# Patient Record
Sex: Male | Born: 1985 | Race: Black or African American | Hispanic: No | Marital: Single | Smoking: Current every day smoker
Health system: Southern US, Community
[De-identification: ages and names within clinical notes are randomized; demographics above are authoritative.]

## PROBLEM LIST (undated history)

## (undated) ENCOUNTER — Emergency Department (HOSPITAL_BASED_OUTPATIENT_CLINIC_OR_DEPARTMENT_OTHER): Discharge status: Against Medical Advice | Payer: Medicaid Other

## (undated) DIAGNOSIS — F419 Anxiety disorder, unspecified: Secondary | ICD-10-CM

## (undated) DIAGNOSIS — F209 Schizophrenia, unspecified: Secondary | ICD-10-CM

## (undated) DIAGNOSIS — F32A Depression, unspecified: Secondary | ICD-10-CM

## (undated) DIAGNOSIS — F329 Major depressive disorder, single episode, unspecified: Secondary | ICD-10-CM

## (undated) DIAGNOSIS — F3181 Bipolar II disorder: Secondary | ICD-10-CM

---

## 2012-05-04 ENCOUNTER — Encounter (HOSPITAL_COMMUNITY): Payer: Self-pay | Admitting: Emergency Medicine

## 2012-05-04 ENCOUNTER — Encounter (HOSPITAL_COMMUNITY): Payer: Self-pay | Admitting: Behavioral Health

## 2012-05-04 ENCOUNTER — Emergency Department (EMERGENCY_DEPARTMENT_HOSPITAL)
Admission: EM | Admit: 2012-05-04 | Discharge: 2012-05-04 | Disposition: A | Discharge status: Other Acute Inpatient Hospital | Payer: Medicaid Other | Source: Home / Self Care | Attending: Emergency Medicine | Admitting: Emergency Medicine

## 2012-05-04 ENCOUNTER — Inpatient Hospital Stay (HOSPITAL_COMMUNITY)
Admission: AD | Admit: 2012-05-04 | Discharge: 2012-05-10 | DRG: 885 | Disposition: A | Discharge status: Home / Self Care | Payer: Medicaid Other | Source: Intra-hospital | Attending: Psychiatry | Admitting: Psychiatry

## 2012-05-04 DIAGNOSIS — F1510 Other stimulant abuse, uncomplicated: Secondary | ICD-10-CM | POA: Insufficient documentation

## 2012-05-04 DIAGNOSIS — F17200 Nicotine dependence, unspecified, uncomplicated: Secondary | ICD-10-CM | POA: Insufficient documentation

## 2012-05-04 DIAGNOSIS — F319 Bipolar disorder, unspecified: Secondary | ICD-10-CM

## 2012-05-04 DIAGNOSIS — F1999 Other psychoactive substance use, unspecified with unspecified psychoactive substance-induced disorder: Secondary | ICD-10-CM

## 2012-05-04 DIAGNOSIS — F3110 Bipolar disorder, current episode manic without psychotic features, unspecified: Principal | ICD-10-CM | POA: Diagnosis present

## 2012-05-04 DIAGNOSIS — R45851 Suicidal ideations: Secondary | ICD-10-CM | POA: Insufficient documentation

## 2012-05-04 DIAGNOSIS — F1210 Cannabis abuse, uncomplicated: Secondary | ICD-10-CM

## 2012-05-04 DIAGNOSIS — Z79899 Other long term (current) drug therapy: Secondary | ICD-10-CM

## 2012-05-04 DIAGNOSIS — F209 Schizophrenia, unspecified: Secondary | ICD-10-CM | POA: Insufficient documentation

## 2012-05-04 DIAGNOSIS — F200 Paranoid schizophrenia: Secondary | ICD-10-CM

## 2012-05-04 DIAGNOSIS — R443 Hallucinations, unspecified: Secondary | ICD-10-CM | POA: Insufficient documentation

## 2012-05-04 HISTORY — DX: Schizophrenia, unspecified: F20.9

## 2012-05-04 HISTORY — DX: Anxiety disorder, unspecified: F41.9

## 2012-05-04 HISTORY — DX: Major depressive disorder, single episode, unspecified: F32.9

## 2012-05-04 HISTORY — DX: Bipolar II disorder: F31.81

## 2012-05-04 HISTORY — DX: Depression, unspecified: F32.A

## 2012-05-04 LAB — COMPREHENSIVE METABOLIC PANEL
Albumin: 4.7 g/dL (ref 3.5–5.2)
BUN: 19 mg/dL (ref 6–23)
Chloride: 100 mEq/L (ref 96–112)
Creatinine, Ser: 1 mg/dL (ref 0.50–1.35)
GFR calc Af Amer: >90 mL/min (ref >90)
GFR calc non Af Amer: >90 mL/min (ref >90)
Glucose, Bld: 95 mg/dL (ref 70–99)
Total Bilirubin: 1.3 mg/dL — ABNORMAL HIGH (ref 0.3–1.2)

## 2012-05-04 LAB — CBC WITH DIFFERENTIAL/PLATELET
Basophils Relative: 0 % (ref 0–1)
HCT: 40.8 % (ref 39.0–52.0)
Hemoglobin: 14.2 g/dL (ref 13.0–17.0)
MCH: 31.1 pg (ref 26.0–34.0)
MCHC: 34.8 g/dL (ref 30.0–36.0)
Monocytes Absolute: 0.7 10*3/uL (ref 0.1–1.0)
Monocytes Relative: 7 % (ref 3–12)
Neutro Abs: 8.1 10*3/uL — ABNORMAL HIGH (ref 1.7–7.7)

## 2012-05-04 LAB — RAPID URINE DRUG SCREEN, HOSP PERFORMED
Cocaine: NOT DETECTED
Opiates: NOT DETECTED
Tetrahydrocannabinol: POSITIVE — AB

## 2012-05-04 LAB — ACETAMINOPHEN LEVEL: Acetaminophen (Tylenol), Serum: <15 ug/mL (ref 10–30)

## 2012-05-04 MED ORDER — NICOTINE 21 MG/24HR TD PT24
21.0000 mg | MEDICATED_PATCH | Freq: Every day | TRANSDERMAL | Status: DC
  Administered 2012-05-05 – 2012-05-10 (×4): 21 mg via TRANSDERMAL
  Filled 2012-05-04 (×7): qty 1

## 2012-05-04 MED ORDER — ZOLPIDEM TARTRATE 5 MG PO TABS: 5.0000 mg | ORAL_TABLET | Freq: Every evening | ORAL | Status: DC | PRN

## 2012-05-04 MED ORDER — ACETAMINOPHEN 325 MG PO TABS: 650.0000 mg | ORAL_TABLET | Freq: Four times a day (QID) | ORAL | Status: DC | PRN

## 2012-05-04 MED ORDER — LORAZEPAM 1 MG PO TABS: 1.0000 mg | ORAL_TABLET | Freq: Three times a day (TID) | ORAL | Status: DC | PRN

## 2012-05-04 MED ORDER — ACETAMINOPHEN 325 MG PO TABS: 650.0000 mg | ORAL_TABLET | ORAL | Status: DC | PRN

## 2012-05-04 MED ORDER — ALUM & MAG HYDROXIDE-SIMETH 200-200-20 MG/5ML PO SUSP: 30.0000 mL | ORAL | Status: DC | PRN

## 2012-05-04 MED ORDER — IBUPROFEN 600 MG PO TABS: 600.0000 mg | ORAL_TABLET | Freq: Three times a day (TID) | ORAL | Status: DC | PRN

## 2012-05-04 MED ORDER — NICOTINE 21 MG/24HR TD PT24: 21.0000 mg | MEDICATED_PATCH | Freq: Every day | TRANSDERMAL | Status: DC

## 2012-05-04 MED ORDER — QUETIAPINE FUMARATE 50 MG PO TABS
50.0000 mg | ORAL_TABLET | Freq: Every day | ORAL | Status: DC
  Administered 2012-05-04: 50 mg via ORAL
  Filled 2012-05-04 (×3): qty 1

## 2012-05-04 MED ORDER — LORAZEPAM 1 MG PO TABS
1.0000 mg | ORAL_TABLET | Freq: Three times a day (TID) | ORAL | Status: DC | PRN
  Administered 2012-05-05 – 2012-05-08 (×2): 1 mg via ORAL
  Filled 2012-05-04 (×2): qty 1

## 2012-05-04 MED ORDER — MAGNESIUM HYDROXIDE 400 MG/5ML PO SUSP: 30.0000 mL | Freq: Every day | ORAL | Status: DC | PRN

## 2012-05-04 MED ORDER — ONDANSETRON HCL 4 MG PO TABS: 4.0000 mg | ORAL_TABLET | Freq: Three times a day (TID) | ORAL | Status: DC | PRN
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
## 2012-05-04 NOTE — ED Notes (Signed)
Discharged to [facility] 400 hall. Worried about his check and "Melissa" spending his money. Sent by security w/2 personal belongings bags.

## 2012-05-04 NOTE — ED Notes (Signed)
Dr jonnalagadda into see 

## 2012-05-04 NOTE — [facility] Counselor (Signed)
Pt is accepted to [facility] 403/1, attending is Dr. Jannifer Franklin. [name], LCAS

## 2012-05-04 NOTE — Progress Notes (Signed)
Adult Psychoeducational Group Note  Date:  05/04/2012 Time:  9:14 PM  Group Topic/Focus:  Wrap-Up Group:   The focus of this group is to help patients review their daily goal of treatment and discuss progress on daily workbooks.  Participation Level:  Active  Participation Quality:  Appropriate  Affect:  Appropriate  Cognitive:  Alert  Insight: Limited  Engagement in Group:  Off Topic  Modes of Intervention:  Discussion  Additional Comments:  Pt appeared to be very manic and having a flight of ideas. Pt is fixated on "melissa" and if she is going to cash his disability check.  Kaleen Odea R 05/04/2012, 9:14 PM

## 2012-05-04 NOTE — ED Notes (Addendum)
Tries to talk to this nurse but all he can do is mumble words and express nothing, fast talking, soft talking. Cannot articulate his words to express himself. Does state that "they are going to jump him". When asked who are "they", he cannot identify. Informed patient no one is here to hurt him, he is safe. Came out of room into hallway and started mumbling to "Melissa" telling her she is "white trash". Nurse looks in hallway and "Melissa nor anyone else is present".
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
## 2012-05-04 NOTE — Progress Notes (Signed)
Admission Note  D: Patient admitted to [facility] from [facility] ED. Patient cooperative with staff. Patient is responding to internal stimuli. He stated that "a girl is yelling and keep saying she's going to kill herself, but I told her that I love myself", "she said I have a black card, but it's in my wallet and then I heard the helicopter". Patient verbalized that he wants to come here and get help so he can make some positive changes in his life. He also verbalized that prior to coming here he was having some self harm thoughts and wanted to cut himself. Patient presents with disorganized thoughts and at times is disoriented, but easily redirected.  A: Support and encouragement provided to patient. Oriented patient to the unit and informed him of the unit rules/policies. Monitor Q15 minute checks for safety.  R: Patient receptive. Denies SI/HI/AVH at this time. Patient remains safe on the unit.

## 2012-05-04 NOTE — ED Notes (Addendum)
Pt states he is bipolar and schizophrenic and has been having audible hallucinations and thoughts of hurting himself  Pt called the police that brought him in for medical clearance  Pt is here voluntarily  Pt keeps talking of needing to get to the other side of town  Pt speaking softly rambling in conversation
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
## 2012-05-05 DIAGNOSIS — F3110 Bipolar disorder, current episode manic without psychotic features, unspecified: Secondary | ICD-10-CM | POA: Diagnosis present

## 2012-05-05 DIAGNOSIS — F200 Paranoid schizophrenia: Secondary | ICD-10-CM | POA: Diagnosis present

## 2012-05-05 DIAGNOSIS — F309 Manic episode, unspecified: Secondary | ICD-10-CM

## 2012-05-05 LAB — HEPATIC FUNCTION PANEL
AST: 42 U/L — ABNORMAL HIGH (ref 0–37)
Albumin: 3.9 g/dL (ref 3.5–5.2)

## 2012-05-05 MED ORDER — QUETIAPINE FUMARATE 200 MG PO TABS
200.0000 mg | ORAL_TABLET | Freq: Every day | ORAL | Status: DC
  Administered 2012-05-05: 200 mg via ORAL
  Filled 2012-05-05 (×3): qty 1

## 2012-05-05 MED ORDER — QUETIAPINE FUMARATE 50 MG PO TABS
50.0000 mg | ORAL_TABLET | Freq: Two times a day (BID) | ORAL | Status: DC
  Administered 2012-05-05 – 2012-05-07 (×2): 50 mg via ORAL
  Filled 2012-05-05 (×6): qty 1

## 2012-05-05 MED ORDER — QUETIAPINE FUMARATE 400 MG PO TABS
400.0000 mg | ORAL_TABLET | Freq: Every day | ORAL | Status: DC
  Filled 2012-05-05 (×2): qty 1

## 2012-05-05 NOTE — [facility] Suicide Risk Assessment (Signed)
Suicide Risk Assessment  Admission Assessment     Nursing information obtained from:   Electronic Medical Records and Staff reports. Demographic factors:   Housing, unemployment, substance abuse. Male, under 26 y/o. Current Mental Status:   Patient has endorsed passive SI, Auditory and visual hallucinations. Currently manic with rapid pressured speech. Loss Factors:   Poor Primary support Historical Factors:   Illicit Substance use; Noncompliance with treatment; patient reports 3 suicide attempts in the past. Risk Reduction Factors:   Interested in treatment.   CLINICAL FACTORS:   Bipolar Disorder Schizophrenia, Paranoid type, Substance Abuse Noncompliant with treatment.   COGNITIVE FEATURES THAT CONTRIBUTE TO RISK:  Polarized thinking    SUICIDE RISK:   Moderate:  Frequent suicidal ideation with limited intensity, and duration, some specificity in terms of plans, no associated intent, good self-control, limited dysphoria/symptomatology, some risk factors present, and identifiable protective factors, including available and accessible social support.  PLAN OF CARE: 1. Will restart  quetiapine at 50 mg BID and 200 mg at bedtime. Titrate to effective dose. The patient reports that he had be breaking his 400 mg tablet in half due to sedation, but had also been using amphetamines. 2. Will monitor patient on unit and provide a safe, secure environment. 3. Will consider addition of another mood stabilizer, if necessary when maximum tolerable dose of quetiapine has been achieved. 4. Plan for discharge when safe an stable.  I certify that inpatient services furnished can reasonably be expected to improve the patient's condition.  Yousif Edelson 05/05/2012, 11:22 AM
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
## 2012-05-05 NOTE — Clinical Social Work Psychosocial (Signed)
[facility] Group Notes:  (Clinical Social Work)  05/05/2012  [id number]  Summary of Progress/Problems:   The main focus of today's process group was for the patient to identify ways in which they have in the past sabotaged their own recovery. Cognitive Behavioral Therapy concepts about the interconnectedness of thoughts/feelings/actions were introduced, and there was practice time for the techniques of Thought Stopping and Thought Replacement. The patient expressed that he became depressed and mumbled some things about his "girl".  CSW told him we would meet together later for a PSA.  He kept talking about another patient not praying enough, and was difficult to redirect.  Type of Therapy:  Group Therapy - Process - Motivational Interviewing  Participation Level:  Active  Participation Quality:  Intrusive and Sharing  Affect:  Blunted  Cognitive:  Disorganized  Insight:  Limited  Engagement in Therapy:  Engaged  Modes of Intervention:  Education, Support and Processing, Exploration, Discussion   Ambrose Mantle, LCSW 05/05/2012, 12:32 PM

## 2012-05-05 NOTE — Progress Notes (Signed)
Psychoeducational Group Note  Date:  05/05/2012 Time:1000am  Group Topic/Focus:  Identifying Needs:   The focus of this group is to help patients identify their personal needs that have been historically problematic and identify healthy behaviors to address their needs.  Participation Level:  Active  Participation Quality:  Redirectable  Affect:  Not Congruent  Cognitive:  Disorganized  Insight:  Monopolizing  Engagement in Group:  Monopolizing  Additional Comments:  Inventory sheet   Chris [name] 05/05/2012,10:06 AM

## 2012-05-05 NOTE — Progress Notes (Signed)
Adult Psychoeducational Group Note  Date:  05/05/2012 Time:  9:12 PM  Group Topic/Focus:  Wrap-Up Group:   The focus of this group is to help patients review their daily goal of treatment and discuss progress on daily workbooks.  Participation Level:  Minimal  Participation Quality:  Appropriate  Affect:  Flat  Cognitive:  Appropriate  Insight: Improving  Engagement in Group:  Engaged  Modes of Intervention:  Support  Additional Comments:  Patient attended and participated in group tonight. He reports having a good day. He attended his groups and his meals.  Lita Mains [facility] 05/05/2012, 9:12 PM

## 2012-05-05 NOTE — Progress Notes (Signed)
Patient ID: Chris [name], male   DOB: October 06, 1985, 27 y.o.   MRN: [id number] 05-05-2012 nursing shift note: D: met with pt this am and assessed him. He has loose associations, is disorganized and has pressured speech. A: he took his medications and is preoccupied with his gf who is the payee of his disability money. He is having some passive si.  R: he was able to contract for the si. Advised him to investigate options regarding his payee. He stated she will visit this evening. rn will monitor and q 15 min cks continue.

## 2012-05-05 NOTE — Progress Notes (Signed)
Patient ID: Chris [name], male   DOB: 1985/06/25, 27 y.o.   MRN: [id number] D)  Has been somewhat anxious this evening, but agreed to come to the dayroom for group.  When it was his turn, he spoke almost continuously, speech was rapid and pressured, soft spoken, and it was difficult to tell what he was saying at times.  Has voiced some concern that his payee may be forging his name and cashing his checks.  Also stated he wanted to get himself straightened out and get back in school for his GED..  Came to the med window and asked if he had seroquel ordered, which he takes at night.  Order was obtained for 50 mg with a repeat, which he agreed to take, but stated usually takes 400 mg.   A)  Has been cooperative this evening, but focused on his check and his payee.  Will continue to monitor q 15 minutes for safety, R)  Safety maintained.  02:00 Addendum:  Has been awake tonight, refused repeat seroquel, but was given additional snacks and seems to be settling down and is back in bed.  Will continue to monitor.

## 2012-05-05 NOTE — Progress Notes (Signed)
Patient ID: Chris [name], male   DOB: [birth year], 27 y.o.   MRN: [id number] D;"I need something for my anxiety and to help me sleep."  Pt. asked for various controlled drugs. A: Pt. Is alert, oriented X4 and responds to questions appropriately. Pt. Is appropriate with staff and peers, but keeps his voice soft and pace in a normal to rapid rate. Cadence of speech pattern is also WDL: Pt. seems preoccupied with obtaining medications: accepted Seroquel at [facility].  Pt. States he has A/V/H's, but did attend group tonight and participated per MHT report.  Pt. Contracts for safety. R: Will continue to monitor for changes in behavior.

## 2012-05-05 NOTE — Progress Notes (Signed)
Psychoeducational Group Note  Date:  05/05/2012 Time:  0945 am  Group Topic/Focus:  Identifying Needs:   The focus of this group is to help patients identify their personal needs that have been historically problematic and identify healthy behaviors to address their needs.  Participation Level:  Active  Participation Quality:  Appropriate, Attentive, Sharing and Supportive  Affect:  Appropriate  Cognitive:  Appropriate  Insight:  Developing/Improving  Engagement in Group:  Engaged  Additional Comments:    Andrena Mews 05/05/2012,11:25 AM

## 2012-05-06 DIAGNOSIS — F39 Unspecified mood [affective] disorder: Secondary | ICD-10-CM

## 2012-05-06 DIAGNOSIS — F1020 Alcohol dependence, uncomplicated: Secondary | ICD-10-CM

## 2012-05-06 MED ORDER — QUETIAPINE FUMARATE 300 MG PO TABS
300.0000 mg | ORAL_TABLET | Freq: Every day | ORAL | Status: DC
  Administered 2012-05-06: 300 mg via ORAL
  Filled 2012-05-06 (×3): qty 1

## 2012-05-06 NOTE — Progress Notes (Signed)
[facility] MD Progress Note  05/06/2012 5:57 PM Chris [name]  MRN:  [id number] Subjective:  Patient reports that he is more hopeful today. He is working with the Child psychotherapist to obtain an ID. He denies any side effects from his medications.  He reports that if he were to leave today though he would most likely harm himself.   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 Intent:  does not feel he could contract for safety outside the hospital Means:  does not feel he could contract for safety outside the hospital Homicidal Ideation:  Plan:  Patient denies. Intent:  Patient denies. Means:  Patient denies. AEB (as evidenced by):  Psychiatric Specialty Exam: ROS  Blood pressure 122/84, pulse 73, temperature 97.1 F (36.2 C), temperature source Oral, resp. rate 18, height 5\' 8"  (1.727 m), weight 58.968 kg (130 lb).Body mass index is 19.77 kg/(m^2).  General Appearance: Fairly Groomed  Patent attorney::  Fair  Speech:  Clear and Coherent and Normal Rate  Volume:  Normal  Mood:  Anxious  Affect:  Restricted  Thought Process:  Coherent  Orientation:  Full (Time, Place, and Person)  Thought Content:  Rumination  Suicidal Thoughts:  Yes.  without intent/plan  Homicidal Thoughts:  No  Memory:  Immediate;   Good Recent;   Fair  Judgement:  Poor  Insight:  Lacking  Psychomotor Activity:  Normal  Concentration:  Fair  Recall:  Fair  Akathisia:  No  Handed:  Right  AIMS (if indicated):     Assets:  Desire for Improvement  Sleep:  Number of Hours: 6.75    Current Medications: Current Facility-Administered Medications  Medication Dose Route Frequency Provider Last Rate Last Dose  . acetaminophen (TYLENOL) tablet 650 mg  650 mg Oral Q6H PRN Shuvon Rankin, NP      . alum & mag hydroxide-simeth (MAALOX/MYLANTA) 200-200-20 MG/5ML  suspension 30 mL  30 mL Oral Q4H PRN Shuvon Rankin, NP      . LORazepam (ATIVAN) tablet 1 mg  1 mg Oral Q8H PRN Shuvon Rankin, NP   1 mg at 05/05/12 0739  . magnesium hydroxide (MILK OF MAGNESIA) suspension 30 mL  30 mL Oral Daily PRN Shuvon Rankin, NP      . nicotine (NICODERM CQ - dosed in mg/24 hours) patch 21 mg  21 mg Transdermal Daily Shuvon Rankin, NP   21 mg at 05/05/12 0738  . QUEtiapine (SEROQUEL) tablet 200 mg  200 mg Oral QHS Sanjuana Kava, NP   200 mg at 05/05/12 2156  . QUEtiapine (SEROQUEL) tablet 50 mg  50 mg Oral BID Sanjuana Kava, NP   50 mg at 05/05/12 1620    Lab Results:  Results for orders placed during the hospital encounter of 05/04/12 (from the past 48 hour(s))  HEPATIC FUNCTION PANEL     Status: Abnormal   Collection Time   05/05/12  7:30 PM      Component Value Range Comment   Total Protein 6.6  6.0 - 8.3 g/dL    Albumin 3.9  3.5 - 5.2 g/dL    AST 42 (*) 0 - 37 U/L    ALT 28  0 - 53 U/L    Alkaline Phosphatase 55  39 - 117 U/L    Total Bilirubin 0.4  0.3 - 1.2 mg/dL  Bilirubin, Direct 0.1  0.0 - 0.3 mg/dL    Indirect Bilirubin 0.3  0.3 - 0.9 mg/dL     Physical Findings: AIMS:  , ,  ,  ,    CIWA:    COWS:     Treatment Plan Summary: Daily contact with patient to assess and evaluate symptoms and progress in treatment Medication management  Plan: 1. Admit for crisis management and stabilization, estimated length of stay 5-7 days.  2. Medication management to reduce current symptoms to base line and improve the patient's overall level of functioning  a) Increase Seroquel 300 mg Q bedtime, and 50 mg bid for anxiety. Dose was lowered after previous note due to sedation. (b): Continue current PRN medications. 3. Treat health problems as indicated.  4. Develop treatment plan to decrease risk of relapse upon discharge and the need for readmission.  5. Psycho-social education regarding relapse prevention and self care.  6. Health care follow up as needed for  medical problems.   Medical Decision Making Problem Points:  Established problem, stable/improving (1) and Review of psycho-social stressors (1) Data Points:  Review or order medicine tests (1) Review of medication regiment & side effects (2) Review of new medications or change in dosage (2)  I certify that inpatient services furnished can reasonably be expected to improve the patient's condition.   Chris [name] 05/06/2012, 5:57 PM
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
## 2012-05-06 NOTE — Clinical Social Work Psychosocial (Signed)
[facility] Group Notes:  (Clinical Social Work)  05/06/2012   11:15-11:45AM  Summary of Progress/Problems:  The main focus of today's process group was to listen to a variety of genres of music and to identify that different types of music provoke different responses.  The patient then was able to identify personally what was soothing for them, as well as energizing.  Examples were given of how to use this knowledge in sleep habits, with depression, and with other symptoms.  The patient expressed enjoyment of many kinds of music, and asked for time to sing a song in the group.  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  He showed appropriate insight into how music made him feel, and how it affects different people in different ways.  Type of Therapy:  Music Therapy with processing done  Participation Level:  Active  Participation Quality:  Attentive and Sharing  Affect:  Appropriate  Cognitive:  Oriented  Insight:  Developing/Improving  Engagement in Therapy:  Engaged  Modes of Intervention:   Socialization, Support and Processing, Exploration, Education, Rapport Building   [employer], LCSW 05/06/2012, 12:40 PM

## 2012-05-06 NOTE — Progress Notes (Signed)
[facility] Group Notes:  (Nursing/MHT/Case Management/Adjunct)  Date:  05/06/2012  Time:  11:10 AM  Type of Therapy:  self inventory  Participation Level:  Did Not Attend   Andrena Mews 05/06/2012, 11:10 AM

## 2012-05-06 NOTE — Progress Notes (Signed)
Patient ID: Chris [name], male   DOB: 13-Mar-1986, 27 y.o.   MRN: [id number] The patient is resting in bed with eyes closed. No distress noted. Will continue to monitor.

## 2012-05-06 NOTE — Progress Notes (Signed)
Psychoeducational Group Note  Date:  05/06/2012 Time:  0945 am  Group Topic/Focus:  Making Healthy Choices:   The focus of this group is to help patients identify negative/unhealthy choices they were using prior to admission and identify positive/healthier coping strategies to replace them upon discharge.  Participation Level:  Active  Participation Quality:  Appropriate and Sharing  Affect:  Appropriate  Cognitive:  Appropriate  Insight:  Engaged  Engagement in Group:  Engaged  Additional Comments:    Andrena Mews 05/06/2012, 10:29 AM

## 2012-05-06 NOTE — Progress Notes (Signed)
D   Pt was very lethargic this morning and did not want to take his seroquel   He later got up and attended the 10 am group and was appropriate but not as animated as yesterday and his speech was very soft and slurred  His thinking is logical  He denies auditory and visual hallucinations and denies suicidal and homicidal ideation A   Verbal support given   Medications administered and effectiveness monitored  Q 15 min checks R   Pt safe at present

## 2012-05-07 DIAGNOSIS — F319 Bipolar disorder, unspecified: Secondary | ICD-10-CM

## 2012-05-07 MED ORDER — OXCARBAZEPINE 150 MG PO TABS
150.0000 mg | ORAL_TABLET | Freq: Two times a day (BID) | ORAL | Status: DC
  Administered 2012-05-07 – 2012-05-10 (×6): 150 mg via ORAL
  Filled 2012-05-07 (×8): qty 1

## 2012-05-07 MED ORDER — ENSURE COMPLETE PO LIQD
237.0000 mL | Freq: Two times a day (BID) | ORAL | Status: DC
  Administered 2012-05-07 – 2012-05-09 (×6): 237 mL via ORAL

## 2012-05-07 MED ORDER — ADULT MULTIVITAMIN W/MINERALS CH
1.0000 | ORAL_TABLET | Freq: Every day | ORAL | Status: DC
  Administered 2012-05-07 – 2012-05-10 (×4): 1 via ORAL
  Filled 2012-05-07 (×5): qty 1

## 2012-05-07 MED ORDER — OXCARBAZEPINE 150 MG PO TABS
150.0000 mg | ORAL_TABLET | Freq: Once | ORAL | Status: AC
  Administered 2012-05-07: 150 mg via ORAL
  Filled 2012-05-07: qty 1

## 2012-05-07 MED ORDER — QUETIAPINE FUMARATE ER 400 MG PO TB24
400.0000 mg | ORAL_TABLET | Freq: Every day | ORAL | Status: DC
  Administered 2012-05-07: 400 mg via ORAL
  Filled 2012-05-07 (×3): qty 1

## 2012-05-07 NOTE — Treatment Plan (Signed)
Interdisciplinary Treatment Plan Update (Adult)  Date: 05/07/2012  Time Reviewed: 8:29 AM   Progress in Treatment: Attending groups: Yes Participating in groups: Yes Taking medication as prescribed: Yes Tolerating medication: Yes   Family/Significant other contact made:  No Patient understands diagnosis:  Yes  As evidenced by asking for help with psychosis and mood stabilization Discussing patient identified problems/goals with staff:  Yes  See below Medical problems stabilized or resolved:  Yes Denies suicidal/homicidal ideation: Yes  In tx team Issues/concerns per patient self-inventory:  Yes  Rates depression and hopelessness at 6  C/O withdrawal symptoms of diarrhea, chilling and agitation  Also blurred vision Other:  New problem(s) identified: N/A  Reason for Continuation of Hospitalization: Hallucinations Mania Medication stabilization  Interventions implemented related to continuation of hospitalization: Trileptal/Seroquel trial  Encourage group attendance and participation    Additional comments:  Estimated length of stay: 3-5 days  Discharge Plan: return home with girlfriend, follow up mental health  New goal(s): N/A  Review of initial/current patient goals per problem list:   1.  Goal(s):Eliminate SI  Met:  Yes  Target date:2/3  As evidenced by: self report  2.  Goal (s): Eliminate psychosis  Met:  Yes  Target date:2/3  As evidenced OZ:[id number] denies psychosis; states no symptoms since he was taking and coming off of the amphetamines  3.  Goal(s): Stabilize mood with the use of medication/therapeutic milieu  Met:  No  Target date:2/7  As evidenced IO:NGEX will rate his depression at a 3 or less [see above for today's rating], and he will be observed getting at least 7 hrs of sleep per night and his speech will be less pressured  4.  Goal(s): Identify outpt provider  Met:  No  Target date:2/7  As evidenced [id number] of appointment time  and date  Attendees: Patient:     Family:     Physician:  Thedore Mins 05/07/2012 8:29 AM   Nursing:  Joslyn Devon  05/07/2012 8:29 AM   Clinical Social Worker:  Richelle Ito 05/07/2012 8:29 AM   Extender:  Verne Spurr PA 05/07/2012 8:29 AM   Other:     Other:     Other:     Other:      Scribe for Treatment Team:   Ida Rogue, 05/07/2012 8:29 AM

## 2012-05-07 NOTE — Progress Notes (Signed)
Nutrition Brief Note  Patient identified on the Malnutrition Screening Tool (MST) Report  Body mass index is 19.77 kg/(m^2). Patient meets criteria for normal weight based on current BMI.   Pt reports not eating anything at all for 1 week PTA. Pt unsure of weight loss. Pt reports he has been eating better since admission and has appetite is now excellent. Pt reports he is interested in getting Ensure and multivitamin, will order. Encouraged pt to continue to eat well. Pt without any further nutrition needs/concerns.    Levon Hedger MS, RD, LDN [phone] Pager [phone] After Hours Pager

## 2012-05-07 NOTE — Progress Notes (Signed)
Patient ID: Chris [name], male   DOB: 1986/01/18, 27 y.o.   MRN: [id number] [facility] MD Progress Note  05/07/2012 10:36 AM [name]  MRN:  [id number]  Subjective:  Patient reports that his medication is not working yet, stated that he did not sleep well last night. He remains manic, euphoric and verbalizes racing thoughts. Patient currently denies suicidal ideation and self harming thoughts but still reporting intermittent auditory hallucinations.   Diagnosis:  Axis I:Bipolar Disorder, NOS and Alcohol Depenedence  ADL's:  Intact  Sleep: Fair  Appetite:  Fair  Suicidal Ideation:  Plan:  denies Intent: denies Means: denies Homicidal Ideation:  Plan:  Patient denies. Intent:  Patient denies. Means:  Patient denies. AEB (as evidenced by):  Psychiatric Specialty Exam: Review of Systems  Constitutional: Negative.   HENT: Negative.   Eyes: Negative.   Respiratory: Negative.   Cardiovascular: Negative.   Gastrointestinal: Negative.   Genitourinary: Negative.   Musculoskeletal: Negative.   Skin: Negative.   Neurological: Negative.   Endo/Heme/Allergies: Negative.   Psychiatric/Behavioral: Positive for hallucinations and substance abuse. The patient has insomnia.     Blood pressure 129/69, pulse 106, temperature 98.5 F (36.9 C), temperature source Oral, resp. rate 18, height 5\' 8"  (1.727 m), weight 58.968 kg (130 lb).Body mass index is 19.77 kg/(m^2).  General Appearance: Fairly Groomed  Patent attorney::  Fair  Speech:  Rapid  Volume:  Increased  Mood:  Euphoric  Affect:  Full range  Thought Process:  Coherent  Orientation:  Full (Time, Place, and Person)  Thought Content:  Rumination  Suicidal Thoughts:  Yes.  without intent/plan  Homicidal Thoughts:  No  Memory:  Immediate;   Good Recent;   Fair  Judgement:  Poor  Insight:  Lacking  Psychomotor Activity:  Normal  Concentration:  Fair  Recall:  Fair  Akathisia:  No  Handed:  Right  AIMS (if indicated):     Assets:   Desire for Improvement  Sleep:  Number of Hours: 5.5    Current Medications: Current Facility-Administered Medications  Medication Dose Route Frequency Provider Last Rate Last Dose  . acetaminophen (TYLENOL) tablet 650 mg  650 mg Oral Q6H PRN Shuvon Rankin, NP      . alum & mag hydroxide-simeth (MAALOX/MYLANTA) 200-200-20 MG/5ML suspension 30 mL  30 mL Oral Q4H PRN Shuvon Rankin, NP      . LORazepam (ATIVAN) tablet 1 mg  1 mg Oral Q8H PRN Shuvon Rankin, NP   1 mg at 05/05/12 0739  . magnesium hydroxide (MILK OF MAGNESIA) suspension 30 mL  30 mL Oral Daily PRN Shuvon Rankin, NP      . nicotine (NICODERM CQ - dosed in mg/24 hours) patch 21 mg  21 mg Transdermal Daily Shuvon Rankin, NP   21 mg at 05/05/12 0738  . OXcarbazepine (TRILEPTAL) tablet 150 mg  150 mg Oral BID Jemari Hallum      . QUEtiapine (SEROQUEL XR) 24 hr tablet 400 mg  400 mg Oral QPC supper Jannet Calip        Lab Results:  Results for orders placed during the hospital encounter of 05/04/12 (from the past 48 hour(s))  HEPATIC FUNCTION PANEL     Status: Abnormal   Collection Time   05/05/12  7:30 PM      Component Value Range Comment   Total Protein 6.6  6.0 - 8.3 g/dL    Albumin 3.9  3.5 - 5.2 g/dL    AST 42 (*) 0 - 37 U/L  ALT 28  0 - 53 U/L    Alkaline Phosphatase 55  39 - 117 U/L    Total Bilirubin 0.4  0.3 - 1.2 mg/dL    Bilirubin, Direct 0.1  0.0 - 0.3 mg/dL    Indirect Bilirubin 0.3  0.3 - 0.9 mg/dL     Physical Findings: AIMS:  , ,  ,  ,    CIWA:    COWS:     Treatment Plan Summary: Daily contact with patient to assess and evaluate symptoms and progress in treatment Medication management  Plan: 1. Admit for crisis management and stabilization, estimated length of stay 5-7 days.  2. Medication management to reduce current symptoms to base line and improve the patient's overall level of functioning  A. Increase Seroquel XR 400 mg daily after super for mood/psychosis. B. Will start Trileptal 150mg   BID for labile mood. 3. Treat health problems as indicated.  4. Develop treatment plan to decrease risk of relapse upon discharge and the need for readmission.  5. Psycho-social education regarding relapse prevention and self care.  6. Health care follow up as needed for medical problems.   Medical Decision Making Problem Points:  Established problem, stable/improving (1) and Review of psycho-social stressors (1) Data Points:  Review or order medicine tests (1) Review of medication regiment & side effects (2) Review of new medications or change in dosage (2)  I certify that inpatient services furnished can reasonably be expected to improve the patient's condition.   Mihail Prettyman,MD 05/07/2012, 10:36 AM

## 2012-05-07 NOTE — Progress Notes (Signed)
Adult Psychoeducational Group Note  Date:  05/07/2012 Time:  11:00am  Group Topic/Focus:  Crisis Planning:   The purpose of this group is to help patients create a crisis plan for use upon discharge or in the future, as needed.  Participation Level:  Did Not Attend  Participation Quality:    Affect:    Cognitive:    Insight:   Engagement in Group:    Modes of Intervention:    Additional Comments:  Pt refused to attend.  Isla Pence M 05/07/2012, 6:47 PM

## 2012-05-07 NOTE — Progress Notes (Signed)
D:  Per pt self inventory pt reports sleeping well, appetite good, energy level normal but pt seems hyper, ability to pay attention good, rates depression at a 5 out of 10 and hopelessness at a 6 out of 10.  Pt  Denies SI/HI/AVH, pt c/o anxiety and states that "I need some medication to calm me down."  Pt's speech is rapid and pressured.       A:  Emotional support provided, Encouraged pt to continue with treatment plan and attend all group activities, q15 min checks maintained for safety.  R:  Pt is receptive to treatment plan, attends groups, cooperative with staff and peers.

## 2012-05-07 NOTE — Progress Notes (Signed)
[facility] LCSW Aftercare Discharge Planning Group Note  05/07/2012 1:18 PM  Participation Quality:  Did not attend    [name] B 05/07/2012, 1:18 PM 
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
## 2012-05-07 NOTE — Progress Notes (Signed)
Patient ID: Chris [name], male   DOB: 03/16/1986, 26 y.o.   MRN: [id number] The patient is resting in bed with eyes closed. No distress noted. Will continue to monitor. 

## 2012-05-08 MED ORDER — QUETIAPINE FUMARATE ER 300 MG PO TB24
600.0000 mg | ORAL_TABLET | Freq: Every day | ORAL | Status: DC
  Administered 2012-05-08 – 2012-05-09 (×2): 600 mg via ORAL
  Filled 2012-05-08 (×4): qty 2
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
## 2012-05-08 NOTE — Progress Notes (Signed)
[facility] MD Progress Note  05/08/2012 3:29 PM Chris [name]  MRN:  [id number] Subjective:  "I'm good today, we need to reduce my medication." Met with the patient to discuss his response to treatment. Says the medication is making him sleepy, and he was hearing voices yesterday because he had taken his girlfriend's Vyvanse, but today he states "they are all gone now." Diagnosis:  Bipolar affective disorder, current episode manic                      Substance induced mania  ADL's:  Intact  Sleep: Good  Appetite:  Good  Suicidal Ideation:  Denies  However later in the day after an argument with his girlfriend he stated he was suicidal, but did not have a plan and could contract for safety. Homicidal Ideation:  denies AEB (as evidenced by): Patient's behavior, affect, report of decreased symptoms,and participation in unit programming.  Psychiatric Specialty Exam: Review of Systems  Constitutional: Negative.  Negative for fever, chills, weight loss, malaise/fatigue and diaphoresis.  HENT: Negative for congestion and sore throat.   Eyes: Negative for blurred vision, double vision and photophobia.  Respiratory: Negative for cough, shortness of breath and wheezing.   Cardiovascular: Negative for chest pain, palpitations and PND.  Gastrointestinal: Negative for heartburn, nausea, vomiting, abdominal pain, diarrhea and constipation.  Musculoskeletal: Negative for myalgias, joint pain and falls.  Neurological: Negative for dizziness, tingling, tremors, sensory change, speech change, focal weakness, seizures, loss of consciousness, weakness and headaches.  Endo/Heme/Allergies: Negative for polydipsia. Does not bruise/bleed easily.  Psychiatric/Behavioral: Negative for depression, suicidal ideas, hallucinations, memory loss and substance abuse. The patient is not nervous/anxious and does not have insomnia.     Blood pressure 125/71, pulse 75, temperature 97.1 F (36.2 C), temperature source Oral,  resp. rate 18, height 5\' 8"  (1.727 m), weight 58.968 kg (130 lb).Body mass index is 19.77 kg/(m^2).  General Appearance: Fairly Groomed  Patent attorney::  Good  Speech:  Pressured  Volume:  Decreased  Mood:  Anxious  Affect:  Labile  Thought Process:  Disorganized  Orientation:  Full (Time, Place, and Person)  Thought Content:  Ideas of Reference:   Paranoia  Suicidal Thoughts:  Yes.  without intent/plan  Homicidal Thoughts:  No  Memory:  Immediate;   Poor  Judgement:  Impaired  Insight:  Lacking  Psychomotor Activity:  Normal  Concentration:  Poor  Recall:  Poor  Akathisia:  No  Handed:    AIMS (if indicated):     Assets:  Desire for Improvement  Sleep:  Number of Hours: 6.75    Current Medications: Current Facility-Administered Medications  Medication Dose Route Frequency Provider Last Rate Last Dose  . acetaminophen (TYLENOL) tablet 650 mg  650 mg Oral Q6H PRN Shuvon Rankin, NP      . alum & mag hydroxide-simeth (MAALOX/MYLANTA) 200-200-20 MG/5ML suspension 30 mL  30 mL Oral Q4H PRN Shuvon Rankin, NP      . feeding supplement (ENSURE COMPLETE) liquid 237 mL  237 mL Oral BID BM Lavena Bullion, RD   237 mL at 05/08/12 1349  . LORazepam (ATIVAN) tablet 1 mg  1 mg Oral Q8H PRN Shuvon Rankin, NP   1 mg at 05/08/12 1504  . magnesium hydroxide (MILK OF MAGNESIA) suspension 30 mL  30 mL Oral Daily PRN Shuvon Rankin, NP      . multivitamin with minerals tablet 1 tablet  1 tablet Oral Daily Lavena Bullion, RD   1  tablet at 05/08/12 0757  . nicotine (NICODERM CQ - dosed in mg/24 hours) patch 21 mg  21 mg Transdermal Daily Shuvon Rankin, NP   21 mg at 05/08/12 0756  . OXcarbazepine (TRILEPTAL) tablet 150 mg  150 mg Oral BID Mojeed Akintayo   150 mg at 05/08/12 0756  . QUEtiapine (SEROQUEL XR) 24 hr tablet 400 mg  400 mg Oral QPC supper Mojeed Akintayo   400 mg at 05/07/12 2044    Lab Results: No results found for this or any previous visit (from the past 48 hour(s)).  Physical  Findings: AIMS: Facial and Oral Movements Muscles of Facial Expression: None, normal Lips and Perioral Area: None, normal Jaw: None, normal Tongue: None, normal,Extremity Movements Upper (arms, wrists, hands, fingers): None, normal Lower (legs, knees, ankles, toes): None, normal, Trunk Movements Neck, shoulders, hips: None, normal, Overall Severity Severity of abnormal movements (highest score from questions above): None, normal Incapacitation due to abnormal movements: None, normal Patient's awareness of abnormal movements (rate only patient's report): No Awareness, Dental Status Current problems with teeth and/or dentures?: No Does patient usually wear dentures?: No  CIWA:  CIWA-Ar Total: 0  COWS:     Treatment Plan Summary: Daily contact with patient to assess and evaluate symptoms and progress in treatment Medication management  Plan: 1. Continue for crisis management and stabilization. 2. Medication management to reduce current symptoms to base line and improve the patient's overall level of functioning 3. Treat health problems as indicated. 4. Develop treatment plan to decrease risk of relapse upon discharge and the need for readmission. 5. Psycho-social education regarding relapse prevention and self care. 6. Health care follow up as needed for medical problems. 7. Restart home medications where appropriate. 8. Will increase his seroquel to 600mg  po qd after discussion with Dr. Jannifer Franklin. Medical Decision Making Problem Points:  Established problem, stable/improving (1) Data Points:  Review or order medicine tests (1) Review of medication regiment & side effects (2)  I certify that inpatient services furnished can reasonably be expected to improve the patient's condition.    Rona Ravens. Jhamal Plucinski RPAC 05/08/2012, 3:29 PM

## 2012-05-08 NOTE — Progress Notes (Signed)
Pt denies SI/HI/AVH. Pt denies pain and show no s/s of distress. Pt expressed concerns for discharge, stating that he is feeling better. Pt reported to Clinical research associate that he committed himself and now he is ready for discharge. Pt has rapid, pressured speech. Pt compliant with treatment.  Medications given as ordered per MD. Verbal support given. Pt encouraged to attend groups. 15 minute checks performed for safety. Shift assessment completed.   Pt is cooperative with staff.

## 2012-05-08 NOTE — Progress Notes (Signed)
Adult Psychoeducational Group Note  Date:  05/08/2012 Time:  0930  Group Topic/Focus:  Recovery Goals:   The focus of this group is to identify appropriate goals for recovery and establish a plan to achieve them.  Participation Level:  Minimal  Participation Quality:  Inattentive  Affect:  Flat  Cognitive:  Oriented  Insight: Limited  Engagement in Group:  None  Modes of Intervention:  Education  Additional Comments:  Pt was only concern about when he would be discharged from the hospital. He would not discuss anything else.  Shahida Schnackenberg E 05/08/2012, 1:09 PM

## 2012-05-08 NOTE — Progress Notes (Signed)
The focus of this group is to help patients review their daily goal of treatment and discuss progress on daily workbooks. Pt attended the evening group and responded appropriately to all discussion prompts. Pt reported that he had a good day and that he was hopeful his new medications would work for him. Pt also offered supportive comments to his peers during group, suggesting that it is better to focus on oneself while in the hospital rather than troubles on the outside. Pt said he was trying to do this himself. The Writer observed the Pt smiling throughout the group.

## 2012-05-08 NOTE — Progress Notes (Signed)
Pt reported that he is feeling SI. Pt contacts for safety. Pt reported that he was on the phone with his girlfriend and that she gave him some bad news. Pt reported that his girlfriend told him that she was going to kill herself. Pt tried calling girlfriend back but no answer. Pt given ativan for anxiety. Pt remains safe. Pt is currently sitting in dayroom.15 minute checks performed routinely for safety.

## 2012-05-09 DIAGNOSIS — F3110 Bipolar disorder, current episode manic without psychotic features, unspecified: Principal | ICD-10-CM

## 2012-05-09 NOTE — Progress Notes (Signed)
[facility] LCSW Aftercare Discharge Planning Group Note  05/09/2012 12:05 PM  Participation Quality:  Attentive  Affect:  Excited  Cognitive:  Oriented  Insight:  Improving  Engagement in Group:  Engaged  Modes of Intervention:  Discussion, Exploration and Socialization  Summary of Progress/Problems: Z stated that the incident that threw him for a loop yesterday was the removal of his girlfriend's children from here home, but he is better today.  Went over his standard questions.  Will I give him an ID?  Will I set him up at [facility]?  Will I call his providers at [facility]?  He indicated he is ready to go be supportive to his fiance today.I reminded him that he was suicidal as recently as last night, and that in all likelihood the Dr would not be OK with d/c today as a result.  Later, he cut me off to begin talking again, again about his follow up plan, and, though not appearing angry, was somewhat agitated with pressured speech, and I was not able to redirect him.  Daryel Gerald B 05/09/2012, 12:05 PM

## 2012-05-09 NOTE — Progress Notes (Signed)
Patient ID: Chris [name], male   DOB: 09/27/1985, 27 y.o.   MRN: [id number] [facility] MD Progress Note  05/09/2012 4:22 PM Tyqwan Pink  MRN:  [id number] Subjective:  "I'm good today, I'm going home tomorrow." Met with patient 1:1. He states he is better today and reports no symptoms. When asked about the episode yesterday where he reported that he was suicidal, he states, "I'm good." Ladamien states the problem with his girlfriend is resolved today. Diagnosis:  Bipolar affective disorder, current episode manic                      Substance induced mania  ADL's:  Intact  Sleep: Good  Appetite:  Good  Suicidal Ideation:  Denies   Homicidal Ideation:  denies AEB (as evidenced by): Patient's behavior, affect, report of decreased symptoms,and participation in unit programming.  Psychiatric Specialty Exam: Review of Systems  Constitutional: Negative.  Negative for fever, chills, weight loss, malaise/fatigue and diaphoresis.  HENT: Negative for congestion and sore throat.   Eyes: Negative for blurred vision, double vision and photophobia.  Respiratory: Negative for cough, shortness of breath and wheezing.   Cardiovascular: Negative for chest pain, palpitations and PND.  Gastrointestinal: Negative for heartburn, nausea, vomiting, abdominal pain, diarrhea and constipation.  Musculoskeletal: Negative for myalgias, joint pain and falls.  Neurological: Negative for dizziness, tingling, tremors, sensory change, speech change, focal weakness, seizures, loss of consciousness, weakness and headaches.  Endo/Heme/Allergies: Negative for polydipsia. Does not bruise/bleed easily.  Psychiatric/Behavioral: Negative for depression, suicidal ideas, hallucinations, memory loss and substance abuse. The patient is not nervous/anxious and does not have insomnia.     Blood pressure 124/72, pulse 97, temperature 97.4 F (36.3 C), temperature source Oral, resp. rate 16, height 5\' 8"  (1.727 m), weight 58.968 kg (130  lb).Body mass index is 19.77 kg/(m^2).  General Appearance: Fairly Groomed  Patent attorney::  Good  Speech:  Pressured but improving with increase in medication  Volume:  Decreased  Mood:  Anxious  Affect:  congruent  Thought Process:  Denies AVH  Orientation:  Full (Time, Place, and Person)  Thought Content:  Ideas of Reference:   Paranoia  Suicidal Thoughts:  Yes.  without intent/plan  Homicidal Thoughts:  No  Memory:  Immediate;   Poor  Judgement:  Impaired  Insight:  Lacking  Psychomotor Activity:  Normal  Concentration:  Poor  Recall:  Poor  Akathisia:  No  Handed:    AIMS (if indicated):     Assets:  Desire for Improvement  Sleep:  Number of Hours: 6.75    Current Medications: Current Facility-Administered Medications  Medication Dose Route Frequency Provider Last Rate Last Dose  . acetaminophen (TYLENOL) tablet 650 mg  650 mg Oral Q6H PRN Shuvon Rankin, NP      . alum & mag hydroxide-simeth (MAALOX/MYLANTA) 200-200-20 MG/5ML suspension 30 mL  30 mL Oral Q4H PRN Shuvon Rankin, NP      . feeding supplement (ENSURE COMPLETE) liquid 237 mL  237 mL Oral BID BM Lavena Bullion, RD   237 mL at 05/09/12 1444  . LORazepam (ATIVAN) tablet 1 mg  1 mg Oral Q8H PRN Shuvon Rankin, NP   1 mg at 05/08/12 1504  . magnesium hydroxide (MILK OF MAGNESIA) suspension 30 mL  30 mL Oral Daily PRN Shuvon Rankin, NP      . multivitamin with minerals tablet 1 tablet  1 tablet Oral Daily Lavena Bullion, RD   1 tablet at 05/09/12 [phone]  .  nicotine (NICODERM CQ - dosed in mg/24 hours) patch 21 mg  21 mg Transdermal Daily Shuvon Rankin, NP   21 mg at 05/09/12 0810  . OXcarbazepine (TRILEPTAL) tablet 150 mg  150 mg Oral BID Mojeed Akintayo   150 mg at 05/09/12 0811  . QUEtiapine (SEROQUEL XR) 24 hr tablet 600 mg  600 mg Oral QPC supper Verne Spurr, PA-C   600 mg at 05/08/12 2005    Lab Results: No results found for this or any previous visit (from the past 48 hour(s)).  Physical Findings: AIMS: Facial  and Oral Movements Muscles of Facial Expression: None, normal Lips and Perioral Area: None, normal Jaw: None, normal Tongue: None, normal,Extremity Movements Upper (arms, wrists, hands, fingers): None, normal Lower (legs, knees, ankles, toes): None, normal, Trunk Movements Neck, shoulders, hips: None, normal, Overall Severity Severity of abnormal movements (highest score from questions above): None, normal Incapacitation due to abnormal movements: None, normal Patient's awareness of abnormal movements (rate only patient's report): No Awareness, Dental Status Current problems with teeth and/or dentures?: No Does patient usually wear dentures?: No  CIWA:  CIWA-Ar Total: 0  COWS:     Treatment Plan Summary: Daily contact with patient to assess and evaluate symptoms and progress in treatment Medication management  Plan: 1. Continue for crisis management and stabilization. 2. Medication management to reduce current symptoms to base line and improve the patient's overall level of functioning 3. Treat health problems as indicated. 4. Develop treatment plan to decrease risk of relapse upon discharge and the need for readmission. 5. Psycho-social education regarding relapse prevention and self care. 6. Health care follow up as needed for medical problems. 7. Continue home medications where appropriate. 8.Will continue Seroquel 600mg  at hs. 9. D/C likely tomorrow. Medical Decision Making Problem Points:  Established problem, stable/improving (1) Data Points:  Review or order medicine tests (1) Review of medication regiment & side effects (2)  I certify that inpatient services furnished can reasonably be expected to improve the patient's condition.    Rona Ravens. Dannika Hilgeman RPAC 05/09/2012, 4:22 PM

## 2012-05-09 NOTE — Progress Notes (Signed)
Patient ID: Chris [name], male   DOB: 05-17-1985, 27 y.o.   MRN: [id number]  D:Pt was more difficult to understand today than previous day. Stated he cussed out his fiance because he thought he was going home.  Pt was upset with himself because he believes that if he was not in here, DSS would not have taken his fiance's kids. Writer spoke to pt about his topic from group the night before. Reminded pt that he'd told everyone to stop concentrating on things happening outside of the hosp, and focus on getting better.   A: Support and encouragement was offered. 15 min checks continued for safety.  R: Pt remains safe.

## 2012-05-09 NOTE — Progress Notes (Signed)
The focus of this group is to help patients review their daily goal of treatment and discuss progress on daily workbooks. Pt attended the evening group session and responded to all discussion prompts, but appeared to have disorganized thoughts. Pt's answers changed topic frequently and Pt was difficult to re-direct back on topic. Pt reported having a better day today than yesterday and being very nearly ready to go home. Pt was fidgety in group and did not maintain eye contact.

## 2012-05-09 NOTE — Progress Notes (Signed)
[facility] Group Notes:  (Counselor/Nursing/MHT/Case Management/Adjunct)  02/17/2012 12:00 PM  Type of Therapy:  Group Therapy  Participation Level:  Active  Participation Quality:  Appropriate  Affect:  Appropriate  Cognitive:  Appropriate  Insight:  Engaged  Engagement in Group:  Engaged  Engagement in Therapy:  Engaged  Modes of Intervention:  Discussion, Socialization and Support  Summary of Progress/Problems: MHA: Patient was attentive and engaged with speaker from [employer]. Patient expressed interest in their programs and services and inquired about cost. Pt made some special requests for gospel songs when speaker played guitar and thanked speaker for coming to the unit.   Chris [name] N 02/17/2012, 12:00 PM

## 2012-05-09 NOTE — Progress Notes (Signed)
Pt denies SI/HI/AVH. Pt denies pain and show no s/s of distress. Pt compliant with treatment, attending groups and taking meds. Pt continues to express concerns regarding d/c. Pt informed that if everything goes well, he may be d/c'd tomorrow.   Medications administered as ordered per MD. Verbal support given. Pt encouraged to attend groups. 15 minute checks performed for safety. Shift assessment completed.   Pt is receptive to care. Pt cooperative. Pt remains safe at this time.

## 2012-05-09 NOTE — Progress Notes (Signed)
Patient ID: Chris [name], male   DOB: [birth year], 27 y.o.   MRN: [id number]  D:  "Today was a good day, so far". Pt stated he went to the gym and played basketball. Stated that it's a "good chance" he'll go home tomorrow. Pt was pleased about his discharge.   A: Support and encouragement was offered. 15 min checks continued for safety.  R: Pt remains safe.

## 2012-05-10 MED ORDER — OXCARBAZEPINE 150 MG PO TABS: 150.0000 mg | ORAL_TABLET | Freq: Two times a day (BID) | ORAL | Status: DC

## 2012-05-10 MED ORDER — QUETIAPINE FUMARATE ER 300 MG PO TB24: 600.0000 mg | ORAL_TABLET | Freq: Every day | ORAL | Status: DC

## 2012-05-10 NOTE — Progress Notes (Signed)
[facility] INPATIENT:  Family/Significant Other Suicide Prevention Education  Suicide Prevention Education:   After several attempts, contact could not be made with pt's fiance for suicide prevention education. Suicide prevention pamphlet was explained and given to patient in addition to information card for mobile crisis management (therapeutic alternatives) prior to discharge.

## 2012-05-10 NOTE — Progress Notes (Signed)
[facility] LCSW Aftercare Discharge Planning Group Note  05/10/2012 9:26 AM  Participation Quality:  Appropriate  Affect:  Appropriate  Cognitive:  Appropriate  Insight:  Engaged  Engagement in Group:  Engaged  Modes of Intervention:  Discussion, Problem-solving, Socialization and Support  Summary of Progress/Problems: Pt is scheduled for d/c around 1pm today. He is reporting no SI/HI/AVH and reported that medications are working well. Pt gave alternative number for fiancee in order for sucide risk prevention. Pt was told that he was provided with identification information and group home listings for [facility] in his d/c packet. He requested a letter for [facility] stating that he received treatment at the hospital for upcoming court date. He also requested listings of AA/NA groups in [place] area, which he was told would also be in discharge paperwork.   Nazly Digilio N 05/10/2012, 9:26 AM

## 2012-05-10 NOTE — Discharge Summary (Signed)
Physician Discharge Summary Note  Patient:  Chris [name] is an 26 y.o., male MRN:  [id number] DOB:  July 02, 1985 Patient phone:  [phone] (home)  Patient address:   [address] [place] [address],   Date of Admission:  05/04/2012 Date of Discharge: 05/10/2012  Reason for Admission:  psychosis  Discharge Diagnoses: Principal Problem:  *Bipolar affective disorder, current episode manic Active Problems:  Schizophrenia, paranoid type Discharge Diagnoses:  AXIS I: Bipolar affective disorder, current episode manic  AXIS II: Deferred  AXIS III:  Past Medical History   Diagnosis  Date   AXIS IV: economic problems, occupational problems, other psychosocial or environmental problems and problems related to social environment  AXIS V: 61-70 mild symptoms Review of Systems  Constitutional: Negative.  Negative for fever, chills, weight loss, malaise/fatigue and diaphoresis.  HENT: Negative for congestion and sore throat.   Eyes: Negative for blurred vision, double vision and photophobia.  Respiratory: Negative for cough, shortness of breath and wheezing.   Cardiovascular: Negative for chest pain, palpitations and PND.  Gastrointestinal: Negative for heartburn, nausea, vomiting, abdominal pain, diarrhea and constipation.  Musculoskeletal: Negative for myalgias, joint pain and falls.  Neurological: Negative for dizziness, tingling, tremors, sensory change, speech change, focal weakness, seizures, loss of consciousness, weakness and headaches.  Endo/Heme/Allergies: Negative for polydipsia. Does not bruise/bleed easily.  Psychiatric/Behavioral: Negative for depression, suicidal ideas, hallucinations, memory loss and substance abuse. The patient is not nervous/anxious and does not have insomnia.     Level of Care:  OP  Hospital Course:  Chris [name] was admitted after coming to the ED reporting hearing voices after taking some pills and smoking some weed.  Chris [name] states Chris [name] has a history of  bipolar affective disorder for which Chris [name] takes Seroquel, but noted that Chris [name] had not taken his medication in over a week.  Chris [name] states Chris [name] took some of his girlfriend's pills but did not know what they were.  Chris [name] tested positive for amphetamines on the UDS.  Chris [name] later admitted to taking Vyvanse.  After Chris [name] was given medical clearance Chris [name] was transferred to [facility] for further stabilization.     Chris [name] was restarted on his Seroquel XR an titrated to 600mg  a day at hs, and his trileptal 150mg  a day.  Chris [name] was resistant to increasing his medication to 600mg  but did so.  On his second day of admission Chris [name] argued with his girlfriend and spent the day stating Chris [name] was suicidal after denying suicidal ideation earlier in the day.  Chris [name] did not need a 1:1 and was safe on the unit.  Chris [name] tolerated the increase in medication without difficulty.  Chris [name] continued to report a reduction in symptoms and a resolution to the voices Chris [name] was hearing. Chris [name] stated that Chris [name] was ready for discharge and requested to be discharged stating Chris [name] wanted to go back to school.  On the day of discharge Chris [name] denied voices, SI/HI and was in full contact with reality. Chris [name] continued to show limited insight into his situation regarding the regular use of medication and the avoidance of drug abuse but was felt to be safe for discharge home.  Consults:  None  Significant Diagnostic Studies:  labs: please see all labs associated with this visit via EMR  Discharge Vitals:   Blood pressure 121/68, pulse 95, temperature 98.6 F (37 C), temperature source Oral, resp. rate 14, height 5\' 8"  (1.727 m), weight 58.968 kg (130 lb). Body mass index is 19.77 kg/(m^2). Lab Results:   No results  found for this or any previous visit (from the past 72 hour(s)).  Physical Findings: AIMS: Facial and Oral Movements Muscles of Facial Expression: None, normal Lips and Perioral Area: None, normal Jaw: None, normal Tongue: None, normal,Extremity Movements Upper (arms, wrists,  hands, fingers): None, normal Lower (legs, knees, ankles, toes): None, normal, Trunk Movements Neck, shoulders, hips: None, normal, Overall Severity Severity of abnormal movements (highest score from questions above): None, normal Incapacitation due to abnormal movements: None, normal Patient's awareness of abnormal movements (rate only patient's report): No Awareness, Dental Status Current problems with teeth and/or dentures?: No Does patient usually wear dentures?: No  CIWA:  CIWA-Ar Total: 0  COWS:     Psychiatric Specialty Exam: See Psychiatric Specialty Exam and Suicide Risk Assessment completed by Attending Physician prior to discharge.  Discharge destination:  Home  Is patient on multiple antipsychotic therapies at discharge:  No   Has Patient had three or more failed trials of antipsychotic monotherapy by history:  No  Recommended Plan for Multiple Antipsychotic Therapies:Not applicable Discharge Orders    Future Orders Please Complete By Expires   Diet - low sodium heart healthy      Increase activity slowly      Discharge instructions      Comments:   Take all your medications as prescribed by your mental healthcare provider. Report any adverse effects and or reactions from your medicines to your outpatient provider promptly. Patient is instructed and cautioned to not engage in alcohol and or illegal drug use while on prescription medicines. In the event of worsening symptoms, patient is instructed to call the crisis hotline, 911 and or go to the nearest ED for appropriate evaluation and treatment of symptoms. Follow-up with your primary care provider for your other medical issues, concerns and or health care needs.       Medication List     As of 05/10/2012 10:38 AM    STOP taking these medications         QUEtiapine 400 MG tablet   Commonly known as: SEROQUEL      TAKE these medications      Indication    OXcarbazepine 150 MG tablet   Commonly known as:  TRILEPTAL   Take 1 tablet (150 mg total) by mouth 2 (two) times daily. For mood stabilization.    Indication: Manic-Depression      QUEtiapine 300 MG 24 hr tablet   Commonly known as: SEROQUEL XR   Take 2 tablets (600 mg total) by mouth daily after supper. For psychosis and mental clarity.    Indication: Manic-Depression           Follow-up Information    Follow up with [facility]. (Walk-in between 8am-9am Monday thru Friday for hospital followup. )    Contact information:   [address], [place] [address] phone: [phone] fax: [phone]          Follow-up recommendations:  As noted above  Comments:    Total Discharge Time:  >30 minutes  Signed:  Lloyd Huger T. Aigner Horseman  RPAC 05/10/2012, 10:38 AM

## 2012-05-10 NOTE — Progress Notes (Signed)
[facility] INPATIENT:  Family/Significant Other Suicide Prevention Education  Suicide Prevention Education:  Contact Attempts: [name] and [place] (finance and finance mother) has been identified by the patient as the family member/significant other with whom the patient will be residing, and identified as the person(s) who will aid the patient in the event of a mental health crisis.  With written consent from the patient, two attempts were made to provide suicide prevention education, prior to and/or following the patient's discharge.  We were unsuccessful in providing suicide prevention education.  A suicide education pamphlet was given to the patient to share with family/significant other.  Date and time of first attempt:05/09/12 To fiance [name] [phone] Date and time of second attempt:05/10/12 To fiance [name]: (same number above) and left message. Alternative number provided by patient: [name] Mother; Jones Bales [phone]. This was given by patient in an attempt to provide suicide education. Could not reach, thus also left a message.   Nail, Catalina Gravel 05/10/2012, 10:18 AM

## 2012-05-10 NOTE — Progress Notes (Addendum)
Patient ID: Chris [name], male   DOB: Sep 18, 1985, 27 y.o.   MRN: [id number] Patient to be discharged home today per MD order.  Patient denies any SI/HI/AVH.  He will follow up with his regular psychiatrist, Dr. Omelia Blackwater and get his medications from [facility].  Patient is vested in his treatment.    Initiate discharge paperwork and review medications and follow up information with patient.    1210:  Patient discharged; received all belongings from locker.  Patient received list of AA/NA meetings in area.  He also received a list of group homes and their numbers in the area.  Patient left with fiance.  He denies any SI/HI/AVH.  He left ambulatory without any incident.

## 2012-05-10 NOTE — [facility] Suicide Risk Assessment (Signed)
Suicide Risk Assessment  Discharge Assessment     Demographic Factors:  Male, Low socioeconomic status and Unemployed  Mental Status Per Nursing Assessment::   On Admission:     Current Mental Status by Physician: patient denies suicidal ideation,intent or plan.  Loss Factors: Decrease in vocational status and Financial problems/change in socioeconomic status  Historical Factors: NA  Risk Reduction Factors:   Positive social support  Continued Clinical Symptoms:  Resolving psychosis and mood symptoms  Cognitive Features That Contribute To Risk:  Closed-mindedness Polarized thinking    Suicide Risk:  Minimal: No identifiable suicidal ideation.  Patients presenting with no risk factors but with morbid ruminations; may be classified as minimal risk based on the severity of the depressive symptoms  Discharge Diagnoses:   AXIS I:  Bipolar affective disorder, current episode manic  AXIS II:  Deferred AXIS III:   Past Medical History  Diagnosis Date   AXIS IV:  economic problems, occupational problems, other psychosocial or environmental problems and problems related to social environment AXIS V:  61-70 mild symptoms  Plan Of Care/Follow-up recommendations:  Activity:  as tolerated. Diet:  healthy Tests:  routine blood test. Other:  patient to keep his after care appointment.  Is patient on multiple antipsychotic therapies at discharge:  No   Has Patient had three or more failed trials of antipsychotic monotherapy by history:  No  Recommended Plan for Multiple Antipsychotic Therapies: N/A  Ashmi Blas,MD 05/10/2012, 9:26 AM

## 2012-05-15 NOTE — Progress Notes (Signed)
Patient Discharge Instructions:  After Visit Summary (AVS):   Faxed to:  05/15/12 Discharge Summary Note:   Faxed to:  05/15/12 Psychiatric Admission Assessment Note:   Faxed to:  05/15/12 Suicide Risk Assessment - Discharge Assessment:   Faxed to:  05/15/12 Faxed/Sent to the Next Level Care provider:  05/15/12 Faxed to [facility] @ [phone]   Jerelene Redden, 05/15/2012, 4:19 PM

## 2012-05-16 NOTE — Discharge Summary (Signed)
Seen and agreed. [name], MD 

## 2012-05-26 ENCOUNTER — Encounter (HOSPITAL_COMMUNITY): Payer: Self-pay | Admitting: Emergency Medicine

## 2012-05-26 ENCOUNTER — Emergency Department (HOSPITAL_COMMUNITY): Payer: Medicaid Other

## 2012-05-26 ENCOUNTER — Emergency Department (HOSPITAL_COMMUNITY)
Admission: EM | Admit: 2012-05-26 | Discharge: 2012-05-26 | Disposition: A | Discharge status: Home / Self Care | Payer: Medicaid Other | Attending: Emergency Medicine | Admitting: Emergency Medicine

## 2012-05-26 DIAGNOSIS — F209 Schizophrenia, unspecified: Secondary | ICD-10-CM | POA: Insufficient documentation

## 2012-05-26 DIAGNOSIS — F17200 Nicotine dependence, unspecified, uncomplicated: Secondary | ICD-10-CM | POA: Insufficient documentation

## 2012-05-26 DIAGNOSIS — F3189 Other bipolar disorder: Secondary | ICD-10-CM | POA: Insufficient documentation

## 2012-05-26 DIAGNOSIS — S022XXA Fracture of nasal bones, initial encounter for closed fracture: Secondary | ICD-10-CM | POA: Insufficient documentation

## 2012-05-26 DIAGNOSIS — S0181XA Laceration without foreign body of other part of head, initial encounter: Secondary | ICD-10-CM

## 2012-05-26 DIAGNOSIS — S0180XA Unspecified open wound of other part of head, initial encounter: Secondary | ICD-10-CM | POA: Insufficient documentation

## 2012-05-26 MED ORDER — NAPROXEN 500 MG PO TABS: 500.0000 mg | ORAL_TABLET | Freq: Two times a day (BID) | ORAL | Status: DC

## 2012-05-26 NOTE — ED Notes (Signed)
PA at bedside.
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
## 2012-05-26 NOTE — ED Provider Notes (Signed)
Medical screening examination/treatment/procedure(s) were performed by non-physician practitioner and as supervising physician I was immediately available for consultation/collaboration.  Olivia Mackie, MD 05/26/12 [phone]

## 2012-05-29 ENCOUNTER — Emergency Department (HOSPITAL_COMMUNITY)
Admission: EM | Admit: 2012-05-29 | Discharge: 2012-05-30 | Disposition: A | Discharge status: Home / Self Care | Payer: Medicaid Other | Attending: Emergency Medicine | Admitting: Emergency Medicine

## 2012-05-29 ENCOUNTER — Encounter (HOSPITAL_COMMUNITY): Payer: Self-pay | Admitting: *Deleted

## 2012-05-29 ENCOUNTER — Encounter (HOSPITAL_COMMUNITY): Payer: Self-pay | Admitting: Emergency Medicine

## 2012-05-29 ENCOUNTER — Ambulatory Visit (HOSPITAL_COMMUNITY)
Admission: RE | Admit: 2012-05-29 | Discharge: 2012-05-29 | Disposition: A | Discharge status: Home / Self Care | Payer: Medicaid Other | Attending: Psychiatry | Admitting: Psychiatry

## 2012-05-29 DIAGNOSIS — F411 Generalized anxiety disorder: Secondary | ICD-10-CM | POA: Insufficient documentation

## 2012-05-29 DIAGNOSIS — F17200 Nicotine dependence, unspecified, uncomplicated: Secondary | ICD-10-CM | POA: Insufficient documentation

## 2012-05-29 DIAGNOSIS — T43501A Poisoning by unspecified antipsychotics and neuroleptics, accidental (unintentional), initial encounter: Secondary | ICD-10-CM | POA: Insufficient documentation

## 2012-05-29 DIAGNOSIS — F3110 Bipolar disorder, current episode manic without psychotic features, unspecified: Secondary | ICD-10-CM | POA: Insufficient documentation

## 2012-05-29 DIAGNOSIS — Z79899 Other long term (current) drug therapy: Secondary | ICD-10-CM | POA: Insufficient documentation

## 2012-05-29 DIAGNOSIS — T438X2A Poisoning by other psychotropic drugs, intentional self-harm, initial encounter: Secondary | ICD-10-CM | POA: Insufficient documentation

## 2012-05-29 DIAGNOSIS — T43502A Poisoning by unspecified antipsychotics and neuroleptics, intentional self-harm, initial encounter: Secondary | ICD-10-CM | POA: Insufficient documentation

## 2012-05-29 DIAGNOSIS — R45851 Suicidal ideations: Secondary | ICD-10-CM | POA: Insufficient documentation

## 2012-05-29 DIAGNOSIS — F200 Paranoid schizophrenia: Secondary | ICD-10-CM

## 2012-05-29 LAB — COMPREHENSIVE METABOLIC PANEL
ALT: 18 U/L (ref 0–53)
AST: 23 U/L (ref 0–37)
Albumin: 4.1 g/dL (ref 3.5–5.2)
Calcium: 9.2 mg/dL (ref 8.4–10.5)
Creatinine, Ser: 1 mg/dL (ref 0.50–1.35)
Sodium: 141 mEq/L (ref 135–145)

## 2012-05-29 LAB — RAPID URINE DRUG SCREEN, HOSP PERFORMED
Amphetamines: NOT DETECTED
Benzodiazepines: NOT DETECTED
Cocaine: POSITIVE — AB
Opiates: NOT DETECTED

## 2012-05-29 LAB — CBC
MCH: 31.8 pg (ref 26.0–34.0)
MCV: 91.1 fL (ref 78.0–100.0)
Platelets: 210 10*3/uL (ref 150–400)
RDW: 15 % (ref 11.5–15.5)
WBC: 8.6 10*3/uL (ref 4.0–10.5)

## 2012-05-29 LAB — SALICYLATE LEVEL: Salicylate Lvl: <2 mg/dL — ABNORMAL LOW (ref 2.8–20.0)

## 2012-05-29 LAB — ACETAMINOPHEN LEVEL: Acetaminophen (Tylenol), Serum: <15 ug/mL (ref 10–30)

## 2012-05-29 NOTE — ED Notes (Signed)
Pt alert, arrives from [facility], sent fro [facility], pt has been accepted at [facility], will see Dr Shela Commons in Psych ED tomorrow, resp even unlabored, skin pwd, pt has SI, will take prescription medications, pt admits to ETOH, THC
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
## 2012-05-30 DIAGNOSIS — F1410 Cocaine abuse, uncomplicated: Secondary | ICD-10-CM

## 2012-05-30 DIAGNOSIS — F1920 Other psychoactive substance dependence, uncomplicated: Secondary | ICD-10-CM

## 2012-05-30 DIAGNOSIS — F602 Antisocial personality disorder: Secondary | ICD-10-CM

## 2012-05-30 DIAGNOSIS — F1210 Cannabis abuse, uncomplicated: Secondary | ICD-10-CM

## 2012-05-30 MED ORDER — ONDANSETRON HCL 4 MG PO TABS: 4.0000 mg | ORAL_TABLET | Freq: Three times a day (TID) | ORAL | Status: DC | PRN

## 2012-05-30 MED ORDER — ALUM & MAG HYDROXIDE-SIMETH 200-200-20 MG/5ML PO SUSP: 30.0000 mL | ORAL | Status: DC | PRN

## 2012-05-30 MED ORDER — QUETIAPINE FUMARATE ER 400 MG PO TB24
400.0000 mg | ORAL_TABLET | Freq: Every day | ORAL | Status: DC
  Administered 2012-05-30: 400 mg via ORAL
  Filled 2012-05-30 (×2): qty 1

## 2012-05-30 MED ORDER — OXCARBAZEPINE 150 MG PO TABS
150.0000 mg | ORAL_TABLET | Freq: Two times a day (BID) | ORAL | Status: DC
  Administered 2012-05-30 (×2): 150 mg via ORAL
  Filled 2012-05-30 (×3): qty 1

## 2012-05-30 MED ORDER — IBUPROFEN 600 MG PO TABS: 600.0000 mg | ORAL_TABLET | Freq: Three times a day (TID) | ORAL | Status: DC | PRN

## 2012-05-30 MED ORDER — NICOTINE 21 MG/24HR TD PT24
21.0000 mg | MEDICATED_PATCH | Freq: Every day | TRANSDERMAL | Status: DC
  Administered 2012-05-30: 21 mg via TRANSDERMAL
  Filled 2012-05-30: qty 1

## 2012-05-30 MED ORDER — ZOLPIDEM TARTRATE 5 MG PO TABS: 5.0000 mg | ORAL_TABLET | Freq: Every evening | ORAL | Status: DC | PRN

## 2012-05-30 MED ORDER — LORAZEPAM 1 MG PO TABS: 1.0000 mg | ORAL_TABLET | Freq: Three times a day (TID) | ORAL | Status: DC | PRN

## 2012-05-30 NOTE — ED Provider Notes (Signed)
Pt stable awaiting placement  Benny Lennert, MD 05/30/12 [phone]

## 2012-05-30 NOTE — [facility] Counselor (Signed)
Per info from previous [facility] ACT Counselor, pt requesting for IPT, but refusing to sign voluntary admission paperwork & discussing what he should do with girlfriend (who was present at the time). Pt was given additional time for decision and to discuss with girlfriend. Pt declined to sign involuntary paperwork and requested to be d/c. EDP notified by prior ACT. Requested telepsych for review of pt and recommendation for admission or d/c. Telepsych requested by CSW staffing. Pending telepsych & recommendations at this time.

## 2012-05-30 NOTE — ED Notes (Signed)
Increasing agitation regarding Dr.J telling him he was going to be D/C'd. Back and forth from his room to nurses station talking about how he came here for help and if we didn't want to help him he would just go across the street when he is D/C'd from here. Staff talking with patient and trying to redirect him to his room. Dr.J and ACT team discussing disposition in their office.

## 2012-05-30 NOTE — Progress Notes (Signed)
CSW received a consult from RN who stated she received a phone call request for hospital to fax a letter to pt attorney stating that pt was in the hospital.  The request was made by Clint Bolder.  CSW met pt at bedside to gain consent to fax this letterhead.  Pt gave verbal consent for CSW to fax.  CSW faxed letter to Clint Bolder at [phone].  Per request.  Vickii Penna, LCSWA [phone]  Clinical Social Work

## 2012-05-30 NOTE — Consult Note (Signed)
Reason for Consult: Substance abuse and mood disorder Referring Physician: Dr. Thomes Lolling Chris [name] is an 27 y.o. male.  HPI: Patient was seen and chart reviewed. Patient was initially presented at [facility] with the symptoms of irritability agitation suicidal thoughts and thoughts about hurting himself. Patient reported he was [facility] to the [facility] early part of this month and given prescription at the time of discharge. Patient felt he was left the hospital too early feeling not safe. Patient then does this to drinking alcohol smoking marijuana and using crack cocaine. Patient stated he has been living with his fianc and who also involved with drug of abuse. Patient was seen outpatient psychiatrist for possible schizoaffective disorders. Patient evaluation indicates he has court date yesterday and has multiple legal charges against him in [place]. Patient using that screen was positive for cocaine and marijuana on arrival. Patient does not contract for safety and stated he feels he needs admission to behavioral health hospital for a just him his medication.  MSE: Patient stated he is not feeling good he has injury to his nose and left eye brow because he hit self-Few days ago required sutures. Patient was irritable. disorganized. paranoid and unable cooperate for the rest of the history  Past Medical History  Diagnosis Date  . Bipolar 2 disorder   . Schizophrenia   . Depression   . Anxiety     History reviewed. No pertinent past surgical history.  Family History  Problem Relation Age of Onset  . Bipolar disorder Other   . Schizophrenia Other     Social History:  reports that he has been smoking.  He does not have any smokeless tobacco history on file. He reports that he drinks about 1.0 ounces of alcohol per week. He reports that he uses illicit drugs (Marijuana).  Allergies:  Allergies  Allergen Reactions  . Penicillins Hives and Rash     Medications: I have reviewed the patient's current medications.  Results for orders placed during the hospital encounter of 05/29/12 (from the past 48 hour(s))  URINE RAPID DRUG SCREEN (HOSP PERFORMED)     Status: Abnormal   Collection Time    05/29/12  8:28 PM      Result Value Range   Opiates NONE DETECTED  NONE DETECTED   Cocaine POSITIVE (*) NONE DETECTED   Benzodiazepines NONE DETECTED  NONE DETECTED   Amphetamines NONE DETECTED  NONE DETECTED   Tetrahydrocannabinol POSITIVE (*) NONE DETECTED   Barbiturates NONE DETECTED  NONE DETECTED   Comment:            DRUG SCREEN FOR MEDICAL PURPOSES     ONLY.  IF CONFIRMATION IS NEEDED     FOR ANY PURPOSE, NOTIFY LAB     WITHIN 5 DAYS.                LOWEST DETECTABLE LIMITS     FOR URINE DRUG SCREEN     Drug Class       Cutoff (ng/mL)     Amphetamine      1000     Barbiturate      200     Benzodiazepine   200     Tricyclics       300     Opiates          300     Cocaine          300     THC  50  CBC     Status: None   Collection Time    05/29/12  8:40 PM      Result Value Range   WBC 8.6  4.0 - 10.5 K/uL   RBC 4.50  4.22 - 5.81 MIL/uL   Hemoglobin 14.3  13.0 - 17.0 g/dL   HCT 16.1  09.6 - 04.5 %   MCV 91.1  78.0 - 100.0 fL   MCH 31.8  26.0 - 34.0 pg   MCHC 34.9  30.0 - 36.0 g/dL   RDW 40.9  81.1 - 91.4 %   Platelets 210  150 - 400 K/uL  COMPREHENSIVE METABOLIC PANEL     Status: None   Collection Time    05/29/12  8:40 PM      Result Value Range   Sodium 141  135 - 145 mEq/L   Potassium 4.1  3.5 - 5.1 mEq/L   Chloride 103  96 - 112 mEq/L   CO2 30  19 - 32 mEq/L   Glucose, Bld 92  70 - 99 mg/dL   BUN 13  6 - 23 mg/dL   Creatinine, Ser 7.82  0.50 - 1.35 mg/dL   Calcium 9.2  8.4 - 95.6 mg/dL   Total Protein 7.4  6.0 - 8.3 g/dL   Albumin 4.1  3.5 - 5.2 g/dL   AST 23  0 - 37 U/L   ALT 18  0 - 53 U/L   Alkaline Phosphatase 75  39 - 117 U/L   Total Bilirubin 0.4  0.3 - 1.2 mg/dL   GFR calc non Af Amer  >90  >90 mL/min   GFR calc Af Amer >90  >90 mL/min   Comment:            The eGFR has been calculated     using the CKD EPI equation.     This calculation has not been     validated in all clinical     situations.     eGFR's persistently     <90 mL/min signify     possible Chronic Kidney Disease.  ETHANOL     Status: None   Collection Time    05/29/12  8:40 PM      Result Value Range   Alcohol, Ethyl (B) <11  0 - 11 mg/dL   Comment:            LOWEST DETECTABLE LIMIT FOR     SERUM ALCOHOL IS 11 mg/dL     FOR MEDICAL PURPOSES ONLY  ACETAMINOPHEN LEVEL     Status: None   Collection Time    05/29/12  8:40 PM      Result Value Range   Acetaminophen (Tylenol), Serum <15.0  10 - 30 ug/mL   Comment:            THERAPEUTIC CONCENTRATIONS VARY     SIGNIFICANTLY. A RANGE OF 10-30     ug/mL MAY BE AN EFFECTIVE     CONCENTRATION FOR MANY PATIENTS.     HOWEVER, SOME ARE BEST TREATED     AT CONCENTRATIONS OUTSIDE THIS     RANGE.     ACETAMINOPHEN CONCENTRATIONS     >150 ug/mL AT 4 HOURS AFTER     INGESTION AND >50 ug/mL AT 12     HOURS AFTER INGESTION ARE     OFTEN ASSOCIATED WITH TOXIC     REACTIONS.  SALICYLATE LEVEL     Status: Abnormal   Collection Time  05/29/12  8:40 PM      Result Value Range   Salicylate Lvl <2.0 (*) 2.8 - 20.0 mg/dL    No results found.  Positive for aggressive behavior, anxiety, bad mood, behavior problems, bipolar, excessive alcohol consumption, illegal drug usage, mood swings and sleep disturbance Blood pressure 114/67, pulse 62, temperature 97.5 F (36.4 C), temperature source Oral, resp. rate 18, SpO2 99.00%.   Assessment/Plan: Polysubstance dependence Cocaine intoxication Cannabis intoxication Antisocial personality disorder  Recommendation: Patient needed brief acute psychiatric hospitalization for Medicaid shouldn't adjustment and stabilization of his current emotional problem. No medication changes made in the hospital emergency  department.  [name],Chris R. 05/30/2012, 4:55 PM

## 2012-06-01 NOTE — ED Provider Notes (Signed)
Medical screening examination/treatment/procedure(s) were performed by non-physician practitioner and as supervising physician I was immediately available for consultation/collaboration.   Hopie Pellegrin, MD 06/01/12 1103 

## 2012-06-08 ENCOUNTER — Encounter (HOSPITAL_COMMUNITY): Payer: Self-pay | Admitting: *Deleted

## 2012-06-08 ENCOUNTER — Emergency Department (HOSPITAL_COMMUNITY)
Admission: EM | Admit: 2012-06-08 | Discharge: 2012-06-08 | Disposition: A | Discharge status: Home / Self Care | Payer: Medicaid Other | Attending: Emergency Medicine | Admitting: Emergency Medicine

## 2012-06-08 DIAGNOSIS — F17200 Nicotine dependence, unspecified, uncomplicated: Secondary | ICD-10-CM | POA: Insufficient documentation

## 2012-06-08 DIAGNOSIS — F3189 Other bipolar disorder: Secondary | ICD-10-CM | POA: Insufficient documentation

## 2012-06-08 DIAGNOSIS — G2402 Drug induced acute dystonia: Secondary | ICD-10-CM | POA: Insufficient documentation

## 2012-06-08 DIAGNOSIS — F209 Schizophrenia, unspecified: Secondary | ICD-10-CM | POA: Insufficient documentation

## 2012-06-08 DIAGNOSIS — F411 Generalized anxiety disorder: Secondary | ICD-10-CM | POA: Insufficient documentation

## 2012-06-08 DIAGNOSIS — T443X5A Adverse effect of other parasympatholytics [anticholinergics and antimuscarinics] and spasmolytics, initial encounter: Secondary | ICD-10-CM | POA: Insufficient documentation

## 2012-06-08 DIAGNOSIS — Z79899 Other long term (current) drug therapy: Secondary | ICD-10-CM | POA: Insufficient documentation

## 2012-06-08 MED ORDER — BENZTROPINE MESYLATE 1 MG PO TABS: 1.0000 mg | ORAL_TABLET | Freq: Two times a day (BID) | ORAL | Status: DC

## 2012-06-08 MED ORDER — BENZTROPINE MESYLATE 1 MG/ML IJ SOLN
2.0000 mg | Freq: Once | INTRAMUSCULAR | Status: AC
  Administered 2012-06-08: 2 mg via INTRAMUSCULAR
  Filled 2012-06-08: qty 2

## 2012-06-08 NOTE — ED Notes (Signed)
Pt also experiencing involuntary facial twitching.

## 2012-06-08 NOTE — ED Notes (Signed)
[id number]<HY> Expected date:<BR> Expected time:<BR> Means of arrival:<BR> Comments:<BR> 26yo- male-allergic reaction, swollen tongue, SOB

## 2012-06-08 NOTE — ED Provider Notes (Signed)
History     CSN: [id number]  Arrival date & time 06/08/12  1814   First MD Initiated Contact with Patient 06/08/12 1828      Chief Complaint  Patient presents with  . Allergic Reaction    (Consider location/radiation/quality/duration/timing/severity/associated sxs/prior treatment) HPI Patient wit history of schizophrenia got his regular shot on Wednesday.  States usually get cogentin but didn't get it this time.  States eye twitching and tongue twisting.  Patient received benadryl by ems but states only cogentin works.  He denies other problems such as dyspnea, wheezing, or nausea and vomiting.   Past Medical History  Diagnosis Date  . Bipolar 2 disorder   . Schizophrenia   . Depression   . Anxiety     History reviewed. No pertinent past surgical history.  Family History  Problem Relation Age of Onset  . Bipolar disorder Other   . Schizophrenia Other     History  Substance Use Topics  . Smoking status: Current Every Day Smoker -- 0.50 packs/day  . Smokeless tobacco: Not on file  . Alcohol Use: 1.0 oz/week    2 drink(s) per week      Review of Systems  All other systems reviewed and are negative.    Allergies  Penicillins  Home Medications   Current Outpatient Rx  Name  Route  Sig  Dispense  Refill  . diphenhydrAMINE (BENADRYL) 25 MG tablet   Oral   Take 25 mg by mouth every 6 (six) hours as needed for itching.         . OXcarbazepine (TRILEPTAL) 150 MG tablet   Oral   Take 1 tablet (150 mg total) by mouth 2 (two) times daily. For mood stabilization.   60 tablet   0   . QUEtiapine (SEROQUEL XR) 400 MG 24 hr tablet   Oral   Take 400 mg by mouth at bedtime.         . ranitidine (ZANTAC) 150 MG tablet   Oral   Take 150 mg by mouth once.           BP 150/82  Pulse 99  Temp(Src) 98.4 F (36.9 C) (Oral)  Resp 22  SpO2 100%  Physical Exam  Nursing note and vitals reviewed. Constitutional: He is oriented to person, place, and time. He  appears well-developed and well-nourished.  HENT:  Head: Normocephalic and atraumatic.  Eyes: Conjunctivae and EOM are normal. Pupils are equal, round, and reactive to light.  Neck: Normal range of motion.  Cardiovascular: Normal rate, regular rhythm, normal heart sounds and intact distal pulses.   Pulmonary/Chest: Effort normal and breath sounds normal.  Abdominal: Soft. Bowel sounds are normal.  Musculoskeletal: Normal range of motion.  Articulation abnormal due to tongue movements  Neurological: He is alert and oriented to person, place, and time. He has normal reflexes. Gait normal.  Skin: Skin is warm and dry.  Psychiatric:  flat    ED Course  Procedures (including critical care time)  Labs Reviewed - No data to display No results found.   No diagnosis found.    MDM   The patient is schizophrenic and received IM schizophrenic medication on Wednesday and presents today with description of torticollis and oculogyric movements consistent with dystonic  reaction.. The patient states he has received Cogentin in the past for this. He received Benadryl IV per EMS and has not demonstrating these symptoms on my exam except for some in involuntary tongue movement. He'll be given a dose of  IM Cogentin here and a prescription for Cogentin at home.      Hilario Quarry, MD 06/08/12 [phone]

## 2012-06-08 NOTE — ED Notes (Addendum)
Pt arrives from home by [facility] reports pt is having an allergic reaction. Pt drooling on arrival and having involuntary movements of tongue. EMS gave pt benadryl, zantac and epi, pta. Pt reports receiving a shot of seroquel on Wednesday, reports this same thing happens when he takes risperdal without cogentin.

## 2012-07-05 ENCOUNTER — Emergency Department (HOSPITAL_COMMUNITY)
Admission: EM | Admit: 2012-07-05 | Discharge: 2012-07-05 | Disposition: A | Discharge status: Home / Self Care | Payer: Medicaid Other | Attending: Emergency Medicine | Admitting: Emergency Medicine

## 2012-07-05 ENCOUNTER — Encounter (HOSPITAL_COMMUNITY): Payer: Self-pay | Admitting: Emergency Medicine

## 2012-07-05 DIAGNOSIS — T4395XA Adverse effect of unspecified psychotropic drug, initial encounter: Secondary | ICD-10-CM | POA: Insufficient documentation

## 2012-07-05 DIAGNOSIS — T887XXA Unspecified adverse effect of drug or medicament, initial encounter: Secondary | ICD-10-CM | POA: Insufficient documentation

## 2012-07-05 DIAGNOSIS — F3189 Other bipolar disorder: Secondary | ICD-10-CM | POA: Insufficient documentation

## 2012-07-05 DIAGNOSIS — F17200 Nicotine dependence, unspecified, uncomplicated: Secondary | ICD-10-CM | POA: Insufficient documentation

## 2012-07-05 DIAGNOSIS — G2402 Drug induced acute dystonia: Secondary | ICD-10-CM

## 2012-07-05 DIAGNOSIS — R259 Unspecified abnormal involuntary movements: Secondary | ICD-10-CM | POA: Insufficient documentation

## 2012-07-05 DIAGNOSIS — Z79899 Other long term (current) drug therapy: Secondary | ICD-10-CM | POA: Insufficient documentation

## 2012-07-05 DIAGNOSIS — F209 Schizophrenia, unspecified: Secondary | ICD-10-CM | POA: Insufficient documentation

## 2012-07-05 DIAGNOSIS — F411 Generalized anxiety disorder: Secondary | ICD-10-CM | POA: Insufficient documentation

## 2012-07-05 MED ORDER — BENZTROPINE MESYLATE 1 MG PO TABS
1.0000 mg | ORAL_TABLET | Freq: Once | ORAL | Status: AC
  Administered 2012-07-05: 1 mg via ORAL
  Filled 2012-07-05: qty 1

## 2012-07-05 NOTE — ED Provider Notes (Signed)
History    This chart was scribed for non-physician practitioner working with Gavin Pound. Oletta Lamas, MD by Sofie Rower, ED Scribe. This patient was seen in room WTR5/WTR5 and the patient's care was started at 6:25PM.  CSN: [id number]  Arrival date & time 07/05/12  1743   First MD Initiated Contact with Patient 07/05/12 1825      Chief Complaint  Patient presents with  . Allergic Reaction    (Consider location/radiation/quality/duration/timing/severity/associated sxs/prior treatment) Patient is a 27 y.o. male presenting with allergic reaction. The history is provided by the patient and a friend. No language interpreter was used.  Allergic Reaction The primary symptoms do not include shortness of breath, nausea, vomiting or diarrhea. The current episode started 6 to 12 hours ago. The problem has been gradually worsening. This is a new problem.  Associated with: abstained scheduled medication dosage. Associated symptoms comments: Muscle twitching.    Bassel Gaskill is a 27 y.o. male , with a hx of bipolar 2 disorder, schizophrenia, depression and anxiety, who presents to the Emergency Department complaining of sudden, progressively worsening, allergic reaction, onset today (07/05/12). The pt reports he has not taken his cogentin medication today, which he believes, has generated his current state of muscle twitching. In addition, the pt informs, that each time he drinks alcohol, in combination with taking his cogentin, his muscle twitching symptoms present tehmselves. The pt has taken benadryl (last application at 5:15PM, today) which does not provide relief of the symptoms associated with the allergic reaction.   The pt denies nausea, vomiting, diarrhea, and difficulty breathing.   The pt is a current everyday smoker (0.5 packs/day), in addition to drinking alcohol.      Past Medical History  Diagnosis Date  . Bipolar 2 disorder   . Schizophrenia   . Depression   . Anxiety     History  reviewed. No pertinent past surgical history.  Family History  Problem Relation Age of Onset  . Bipolar disorder Other   . Schizophrenia Other     History  Substance Use Topics  . Smoking status: Current Every Day Smoker -- 0.50 packs/day  . Smokeless tobacco: Not on file  . Alcohol Use: 1.0 oz/week    2 drink(s) per week      Review of Systems  Respiratory: Negative for shortness of breath.   Gastrointestinal: Negative for nausea, vomiting and diarrhea.  All other systems reviewed and are negative.    Allergies  Penicillins  Home Medications   Current Outpatient Rx  Name  Route  Sig  Dispense  Refill  . benztropine (COGENTIN) 1 MG tablet   Oral   Take 1 tablet (1 mg total) by mouth 2 (two) times daily.   14 tablet   0   . diphenhydrAMINE (BENADRYL) 25 MG tablet   Oral   Take 25 mg by mouth every 6 (six) hours as needed for itching.         . OXcarbazepine (TRILEPTAL) 150 MG tablet   Oral   Take 1 tablet (150 mg total) by mouth 2 (two) times daily. For mood stabilization.   60 tablet   0   . QUEtiapine (SEROQUEL XR) 400 MG 24 hr tablet   Oral   Take 400 mg by mouth at bedtime.           BP 157/95  Pulse 114  Temp(Src) 99.4 F (37.4 C) (Oral)  Resp 20  Wt 165 lb (74.844 kg)  BMI 25.09 kg/m2  SpO2 100%  Physical Exam  Nursing note and vitals reviewed. Constitutional: He is oriented to person, place, and time. He appears well-developed and well-nourished. No distress.  HENT:  Head: Normocephalic and atraumatic.  Right Ear: External ear normal.  Left Ear: External ear normal.  Nose: Nose normal.  No abnormal movements of tongue   Eyes: Conjunctivae are normal.  Neck: Normal range of motion. No tracheal deviation present.  Cardiovascular: Normal rate, regular rhythm and normal heart sounds.   Pulmonary/Chest: Effort normal and breath sounds normal. No stridor.  Abdominal: Soft. Bowel sounds are normal. He exhibits no distension. There is no  tenderness.  Musculoskeletal: Normal range of motion.  No abnormal movements of extremities   Lymphadenopathy:    He has no cervical adenopathy.  Neurological: He is alert and oriented to person, place, and time. He has normal strength and normal reflexes. He displays no atrophy and no tremor. No sensory deficit. He exhibits normal muscle tone.  Pt is sleepy at the time of exam.   Skin: Skin is warm and dry. He is not diaphoretic.  Psychiatric: He has a normal mood and affect. His behavior is normal.    ED Course  Procedures (including critical care time)  DIAGNOSTIC STUDIES: Oxygen Saturation is 100% on room air, normal by my interpretation.    COORDINATION OF CARE:  6:32 PM- Treatment plan discussed with patient. Pt agrees with treatment.      Labs Reviewed - No data to display No results found.   1. Dystonic drug reaction       MDM  This is a patient who presents today after having an IM injection for bipolar and schizophrenia. Every time he gets this injection and drinks a [employer] he gets this dystonic reaction. His wife gave him 3 Benadryl prior to arrival. He is very sleepy on exam. He has a prescription for Cogentin which he states resolves this reaction. They were unable to get the prescription from the pharmacy today which is why they presented to the ED. 1 mg of Cogentin given orally. Symptoms resolved. Follow up with Dr[facility] Thurston Pounds at [facility]. Return instructions given. Vital signs stable for discharge. Patient / Family / Caregiver informed of clinical course, understand medical decision-making process, and agree with plan.      I personally performed the services described in this documentation, which was scribed in my presence. The recorded information has been reviewed and is accurate.   Mora Bellman, PA-C 07/06/12 [phone]

## 2012-07-05 NOTE — ED Notes (Signed)
Patient took IM injection for bipolar and schizophrenia yesterday from Dr. Penelope Galas at [facility].   Patient did not take his cogentin, and now comes in with muscle twitching and mouth movements.  Girlfriend gave him three benadryl at 1715.

## 2012-07-06 NOTE — ED Provider Notes (Signed)
Medical screening examination/treatment/procedure(s) were performed by non-physician practitioner and as supervising physician I was immediately available for consultation/collaboration.   Rourke Mcquitty Y. Lealer Marsland, MD 07/06/12 2148 

## 2015-11-10 ENCOUNTER — Emergency Department (HOSPITAL_COMMUNITY)
Admission: EM | Admit: 2015-11-10 | Discharge: 2015-11-11 | Disposition: A | Discharge status: Home / Self Care | Payer: Medicaid Other | Attending: Emergency Medicine | Admitting: Emergency Medicine

## 2015-11-10 ENCOUNTER — Encounter (HOSPITAL_COMMUNITY): Payer: Self-pay | Admitting: Emergency Medicine

## 2015-11-10 DIAGNOSIS — F200 Paranoid schizophrenia: Secondary | ICD-10-CM | POA: Diagnosis not present

## 2015-11-10 DIAGNOSIS — Z79899 Other long term (current) drug therapy: Secondary | ICD-10-CM | POA: Diagnosis not present

## 2015-11-10 DIAGNOSIS — F319 Bipolar disorder, unspecified: Secondary | ICD-10-CM | POA: Diagnosis not present

## 2015-11-10 DIAGNOSIS — R45851 Suicidal ideations: Secondary | ICD-10-CM | POA: Insufficient documentation

## 2015-11-10 DIAGNOSIS — F17200 Nicotine dependence, unspecified, uncomplicated: Secondary | ICD-10-CM | POA: Insufficient documentation

## 2015-11-10 DIAGNOSIS — F3110 Bipolar disorder, current episode manic without psychotic features, unspecified: Secondary | ICD-10-CM | POA: Diagnosis present

## 2015-11-10 LAB — SALICYLATE LEVEL

## 2015-11-10 LAB — RAPID URINE DRUG SCREEN, HOSP PERFORMED
Amphetamines: NOT DETECTED
Barbiturates: NOT DETECTED
Benzodiazepines: NOT DETECTED
COCAINE: POSITIVE — AB
OPIATES: NOT DETECTED
TETRAHYDROCANNABINOL: POSITIVE — AB

## 2015-11-10 LAB — COMPREHENSIVE METABOLIC PANEL
ALK PHOS: 46 U/L (ref 38–126)
ALT: 15 U/L — ABNORMAL LOW (ref 17–63)
ANION GAP: 7 (ref 5–15)
AST: 22 U/L (ref 15–41)
Albumin: 4.4 g/dL (ref 3.5–5.0)
BUN: 8 mg/dL (ref 6–20)
CALCIUM: 9.4 mg/dL (ref 8.9–10.3)
CHLORIDE: 105 mmol/L (ref 101–111)
CO2: 28 mmol/L (ref 22–32)
Creatinine, Ser: 1.01 mg/dL (ref 0.61–1.24)
GFR calc non Af Amer: >60 mL/min (ref >60)
Glucose, Bld: 124 mg/dL — ABNORMAL HIGH (ref 65–99)
POTASSIUM: 4 mmol/L (ref 3.5–5.1)
SODIUM: 140 mmol/L (ref 135–145)
Total Bilirubin: 0.8 mg/dL (ref 0.3–1.2)
Total Protein: 7 g/dL (ref 6.5–8.1)

## 2015-11-10 LAB — CBC
HCT: 43.8 % (ref 39.0–52.0)
HEMOGLOBIN: 15 g/dL (ref 13.0–17.0)
MCH: 31.3 pg (ref 26.0–34.0)
MCHC: 34.2 g/dL (ref 30.0–36.0)
MCV: 91.3 fL (ref 78.0–100.0)
Platelets: 227 10*3/uL (ref 150–400)
RBC: 4.8 MIL/uL (ref 4.22–5.81)
RDW: 14 % (ref 11.5–15.5)
WBC: 8 10*3/uL (ref 4.0–10.5)

## 2015-11-10 LAB — ETHANOL: Alcohol, Ethyl (B): <5 mg/dL (ref <5)

## 2015-11-10 LAB — ACETAMINOPHEN LEVEL

## 2015-11-10 MED ORDER — LORAZEPAM 1 MG PO TABS
1.0000 mg | ORAL_TABLET | Freq: Three times a day (TID) | ORAL | Status: DC | PRN
  Administered 2015-11-10: 1 mg via ORAL
  Filled 2015-11-10: qty 1

## 2015-11-10 MED ORDER — ONDANSETRON HCL 4 MG PO TABS: 4.0000 mg | ORAL_TABLET | Freq: Three times a day (TID) | ORAL | Status: DC | PRN

## 2015-11-10 MED ORDER — BENZTROPINE MESYLATE 1 MG PO TABS
1.0000 mg | ORAL_TABLET | Freq: Once | ORAL | Status: AC
  Administered 2015-11-10: 1 mg via ORAL
  Filled 2015-11-10: qty 1

## 2015-11-10 MED ORDER — ALUM & MAG HYDROXIDE-SIMETH 200-200-20 MG/5ML PO SUSP: 30.0000 mL | ORAL | Status: DC | PRN

## 2015-11-10 MED ORDER — ACETAMINOPHEN 325 MG PO TABS: 650.0000 mg | ORAL_TABLET | ORAL | Status: DC | PRN

## 2015-11-10 MED ORDER — IBUPROFEN 400 MG PO TABS: 600.0000 mg | ORAL_TABLET | Freq: Three times a day (TID) | ORAL | Status: DC | PRN

## 2015-11-10 NOTE — ED Notes (Signed)
[facility] and when [facility] after reviewing the SW's note. [facility] is going to check and see what the status of this pt's bed is and call this RN back.

## 2015-11-10 NOTE — ED Notes (Signed)
Pt made food choices. Food tray ordered. Pt aware.

## 2015-11-10 NOTE — ED Notes (Signed)
After speaking with CSW, the pt is to stay here OBS overnight and [facility]

## 2015-11-10 NOTE — ED Provider Notes (Signed)
[facility]-EMERGENCY DEPT Provider Note   CSN: [id number] Arrival date & time: 11/10/15  1257  First Provider Contact:  First MD Initiated Contact with Patient 11/10/15 1650      History   Chief Complaint Chief Complaint  Patient presents with  . Suicidal    HPI Chris [name] is a 30 y.o. male.  Patient with history of substance abuse, schizophrenia, bipolar disorder -- presents with complaints of suicidal ideation. Patient states that he has felt this way for the past 1 day. He notes that he has been using crack cocaine recently. He was recently given scheduled IM Invega. States that he has an increase in paranoia and auditory hallucinations. Otherwise denies any medical complaints or recent illness. The onset of this condition was acute. The course is constant. Aggravating factors: none. Alleviating factors: none.         Past Medical History:  Diagnosis Date  . Anxiety   . Bipolar 2 disorder (HCC)   . Depression   . Schizophrenia [facility](HCC)     Patient Active Problem List   Diagnosis Date Noted  . Bipolar affective disorder, current episode manic (HCC) 05/05/2012    Class: Acute  . Schizophrenia, paranoid type (HCC) 05/05/2012    Class: Chronic    History reviewed. No pertinent surgical history.     Home Medications    Prior to Admission medications   Medication Sig Start Date End Date Taking? Authorizing Provider  benztropine (COGENTIN) 1 MG tablet Take 1 tablet (1 mg total) by mouth 2 (two) times daily. 06/08/12   Margarita Grizzleanielle Ray, MD  diphenhydrAMINE (BENADRYL) 25 MG tablet Take 25 mg by mouth every 6 (six) hours as needed for itching.    Historical Provider, MD  OXcarbazepine (TRILEPTAL) 150 MG tablet Take 1 tablet (150 mg total) by mouth 2 (two) times daily. For mood stabilization. 05/10/12   Tamala JulianNeil T Mashburn, PA-C  QUEtiapine (SEROQUEL XR) 400 MG 24 hr tablet Take 400 mg by mouth at bedtime.    Historical Provider, MD    Family History Family History  Problem Relation  Age of Onset  . Bipolar disorder Other   . Schizophrenia Other     Social History Social History  Substance Use Topics  . Smoking status: Current Every Day Smoker    Packs/day: 0.50  . Smokeless tobacco: Current User  . Alcohol use 1.0 oz/week    2 Standard drinks or equivalent per week     Allergies   Penicillins   Review of Systems Review of Systems  Constitutional: Negative for fever.  HENT: Negative for rhinorrhea and sore throat.   Eyes: Negative for redness.  Respiratory: Negative for cough.   Cardiovascular: Negative for chest pain.  Gastrointestinal: Negative for abdominal pain, diarrhea, nausea and vomiting.  Genitourinary: Negative for dysuria.  Musculoskeletal: Negative for myalgias.  Skin: Negative for rash.  Neurological: Negative for headaches.  Psychiatric/Behavioral: Positive for dysphoric mood, hallucinations and suicidal ideas. Negative for self-injury.    Physical Exam Updated Vital Signs BP 135/87 (BP Location: Right Arm)   Pulse 77   Temp 98.5 F (36.9 C) (Oral)   Resp 18   Wt 64 kg   SpO2 100%   BMI 21.44 kg/m   Physical Exam  Constitutional: He appears well-developed and well-nourished.  HENT:  Head: Normocephalic and atraumatic.  Eyes: Conjunctivae are normal. Right eye exhibits no discharge. Left eye exhibits no discharge.  Neck: Normal range of motion. Neck supple.  Cardiovascular: Normal rate, regular rhythm and normal  heart sounds.   Pulmonary/Chest: Effort normal and breath sounds normal.  Abdominal: Soft. There is no tenderness.  Neurological: He is alert.  Skin: Skin is warm and dry.  Psychiatric: His speech is normal and behavior is normal. His affect is blunt. He expresses suicidal ideation. He expresses no homicidal ideation. He is attentive.  Nursing note and vitals reviewed.   ED Treatments / Results  Labs (all labs ordered are listed, but only abnormal results are displayed) Labs Reviewed  COMPREHENSIVE METABOLIC  PANEL - Abnormal; Notable for the following:       Result Value   Glucose, Bld 124 (*)    ALT 15 (*)    All other components within normal limits  ACETAMINOPHEN LEVEL - Abnormal; Notable for the following:    Acetaminophen (Tylenol), Serum <10 (*)    All other components within normal limits  URINE RAPID DRUG SCREEN, HOSP PERFORMED - Abnormal; Notable for the following:    Cocaine POSITIVE (*)    Tetrahydrocannabinol POSITIVE (*)    All other components within normal limits  ETHANOL  SALICYLATE LEVEL  CBC    EKG  EKG Interpretation None       Radiology No results found.  Procedures Procedures (including critical care time)  Medications Ordered in ED Medications  LORazepam (ATIVAN) tablet 1 mg (not administered)  acetaminophen (TYLENOL) tablet 650 mg (not administered)  ibuprofen (ADVIL,MOTRIN) tablet 600 mg (not administered)  ondansetron (ZOFRAN) tablet 4 mg (not administered)  alum & mag hydroxide-simeth (MAALOX/MYLANTA) 200-200-20 MG/5ML suspension 30 mL (not administered)  benztropine (COGENTIN) tablet 1 mg (1 mg Oral Given 11/10/15 1735)     Initial Impression / Assessment and Plan / ED Course  I have reviewed the triage vital signs and the nursing notes.  Pertinent labs & imaging results that were available during my care of the patient were reviewed by me and considered in my medical decision making (see chart for details).  Clinical Course   5:07 PM Patient seen and examined. Patient is medically cleared. TTS consult requested. Patient requests his evening dose of cogentin. Ordered. Awaiting up-to-date med reconciliation.   Vital signs reviewed and are as follows: BP 135/87 (BP Location: Right Arm)   Pulse 77   Temp 98.5 F (36.9 C) (Oral)   Resp 18   Wt 64 kg   SpO2 100%   BMI 21.44 kg/m   1:25 AM TTS eval -- will monitor overnight. Re-eval in morning.   Handoff to [employer] at shift change.   Final Clinical Impressions(s) / ED Diagnoses    Final diagnoses:  Suicidal ideation    New Prescriptions New Prescriptions   No medications on file     Renne Crigler, PA-C 11/11/15 0126    Loren Racer, MD 11/12/15 1256
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
## 2015-11-10 NOTE — ED Triage Notes (Signed)
Pt. Stated, I feel like killing myself. Since this morning

## 2015-11-11 DIAGNOSIS — F200 Paranoid schizophrenia: Secondary | ICD-10-CM

## 2015-11-11 NOTE — ED Notes (Addendum)
Received call from Psych, states will recommend discharge for patient. Patient is angry regarding this as he wanted to remain in hospital until Friday.  He is agitated and refuses to talk to this RN.

## 2015-11-11 NOTE — ED Notes (Signed)
Patient refusing to leave until [facility],  States they will be providing him transportation.  Called number, but staff is involved in emergency and unable to talk at this time.  Will attempt again, and if no answer, will d/c patient with instructions to call facility himself for a ride.

## 2015-11-11 NOTE — ED Provider Notes (Signed)
Patient was assessed by TTS team and deemed appropriate for discharge. Pt ambulating at baseline, no acute distress on exam, and otherwise medically clear. Plan to follow up as needed and return precautions discussed for worsening or new concerning symptoms.     Lyndal Pulleyaniel Kailana Benninger, MD 11/11/15 [phone]

## 2015-11-11 NOTE — ED Notes (Signed)
Patient was given a snack and drink. A lunch order was taken. 

## 2015-11-11 NOTE — ED Notes (Signed)
Lunch order was emailed. 
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
## 2015-11-11 NOTE — ED Notes (Signed)
Several attempts made to contact ride for patient, advised patient of telephone available in lobby for his use.  Social Worker, Nutritional therapistAshley, provided housing information for patient.  Patient refused to sign belonging's receipt, but items are returned to him.  Clothing was wet, so a clean and dry outfit was supplied to him from the donation closet.  Patient discharged.

## 2015-11-27 ENCOUNTER — Emergency Department (HOSPITAL_COMMUNITY)
Admission: EM | Admit: 2015-11-27 | Discharge: 2015-11-28 | Payer: Medicaid Other | Attending: Emergency Medicine | Admitting: Emergency Medicine

## 2015-11-27 ENCOUNTER — Encounter (HOSPITAL_COMMUNITY): Payer: Self-pay | Admitting: Emergency Medicine

## 2015-11-27 DIAGNOSIS — Z79899 Other long term (current) drug therapy: Secondary | ICD-10-CM | POA: Insufficient documentation

## 2015-11-27 DIAGNOSIS — F17200 Nicotine dependence, unspecified, uncomplicated: Secondary | ICD-10-CM | POA: Insufficient documentation

## 2015-11-27 DIAGNOSIS — F1290 Cannabis use, unspecified, uncomplicated: Secondary | ICD-10-CM | POA: Insufficient documentation

## 2015-11-27 DIAGNOSIS — F17290 Nicotine dependence, other tobacco product, uncomplicated: Secondary | ICD-10-CM | POA: Insufficient documentation

## 2015-11-27 DIAGNOSIS — R45851 Suicidal ideations: Secondary | ICD-10-CM | POA: Diagnosis not present

## 2015-11-27 DIAGNOSIS — R440 Auditory hallucinations: Secondary | ICD-10-CM | POA: Insufficient documentation

## 2015-11-27 DIAGNOSIS — F200 Paranoid schizophrenia: Secondary | ICD-10-CM

## 2015-11-27 LAB — CBC WITH DIFFERENTIAL/PLATELET
Basophils Absolute: 0 10*3/uL (ref 0.0–0.1)
Basophils Relative: 0 %
Eosinophils Absolute: 0.1 10*3/uL (ref 0.0–0.7)
Eosinophils Relative: 1 %
HCT: 37.1 % — ABNORMAL LOW (ref 39.0–52.0)
Hemoglobin: 13 g/dL (ref 13.0–17.0)
Lymphocytes Relative: 24 %
Lymphs Abs: 1.4 10*3/uL (ref 0.7–4.0)
MCH: 30.8 pg (ref 26.0–34.0)
MCHC: 35 g/dL (ref 30.0–36.0)
MCV: 87.9 fL (ref 78.0–100.0)
Monocytes Absolute: 0.5 10*3/uL (ref 0.1–1.0)
Monocytes Relative: 8 %
Neutro Abs: 3.9 10*3/uL (ref 1.7–7.7)
Neutrophils Relative %: 67 %
Platelets: 189 10*3/uL (ref 150–400)
RBC: 4.22 MIL/uL (ref 4.22–5.81)
RDW: 14.1 % (ref 11.5–15.5)
WBC: 5.9 10*3/uL (ref 4.0–10.5)

## 2015-11-27 LAB — RAPID URINE DRUG SCREEN, HOSP PERFORMED
Amphetamines: NOT DETECTED
Barbiturates: NOT DETECTED
Benzodiazepines: POSITIVE — AB
Cocaine: POSITIVE — AB
Opiates: NOT DETECTED
Tetrahydrocannabinol: NOT DETECTED

## 2015-11-27 LAB — BASIC METABOLIC PANEL
Anion gap: 5 (ref 5–15)
BUN: 13 mg/dL (ref 6–20)
CO2: 26 mmol/L (ref 22–32)
Calcium: 8.8 mg/dL — ABNORMAL LOW (ref 8.9–10.3)
Chloride: 108 mmol/L (ref 101–111)
Creatinine, Ser: 1.03 mg/dL (ref 0.61–1.24)
GFR calc Af Amer: >60 mL/min (ref >60)
GFR calc non Af Amer: >60 mL/min (ref >60)
Glucose, Bld: 111 mg/dL — ABNORMAL HIGH (ref 65–99)
Potassium: 3.6 mmol/L (ref 3.5–5.1)
Sodium: 139 mmol/L (ref 135–145)

## 2015-11-27 LAB — ACETAMINOPHEN LEVEL: Acetaminophen (Tylenol), Serum: <10 ug/mL — ABNORMAL LOW (ref 10–30)

## 2015-11-27 LAB — SALICYLATE LEVEL: Salicylate Lvl: <4 mg/dL (ref 2.8–30.0)

## 2015-11-27 LAB — ETHANOL: Alcohol, Ethyl (B): <5 mg/dL (ref <5)

## 2015-11-27 MED ORDER — VITAMIN B-1 100 MG PO TABS
100.0000 mg | ORAL_TABLET | Freq: Every day | ORAL | Status: DC
  Administered 2015-11-28: 100 mg via ORAL
  Filled 2015-11-27: qty 1

## 2015-11-27 MED ORDER — ADULT MULTIVITAMIN W/MINERALS CH
1.0000 | ORAL_TABLET | Freq: Every day | ORAL | Status: DC
  Administered 2015-11-28: 1 via ORAL
  Filled 2015-11-27: qty 1

## 2015-11-27 MED ORDER — LORAZEPAM 1 MG PO TABS: 0.0000 mg | ORAL_TABLET | Freq: Two times a day (BID) | ORAL | Status: DC

## 2015-11-27 MED ORDER — FOLIC ACID 1 MG PO TABS
1.0000 mg | ORAL_TABLET | Freq: Every day | ORAL | Status: DC
  Administered 2015-11-28: 1 mg via ORAL
  Filled 2015-11-27: qty 1

## 2015-11-27 MED ORDER — LORAZEPAM 2 MG/ML IJ SOLN: 1.0000 mg | Freq: Four times a day (QID) | INTRAMUSCULAR | Status: DC | PRN

## 2015-11-27 MED ORDER — ALUM & MAG HYDROXIDE-SIMETH 200-200-20 MG/5ML PO SUSP: 30.0000 mL | ORAL | Status: DC | PRN

## 2015-11-27 MED ORDER — LORAZEPAM 1 MG PO TABS: 1.0000 mg | ORAL_TABLET | Freq: Four times a day (QID) | ORAL | Status: DC | PRN

## 2015-11-27 MED ORDER — THIAMINE HCL 100 MG/ML IJ SOLN: 100.0000 mg | Freq: Every day | INTRAMUSCULAR | Status: DC

## 2015-11-27 MED ORDER — LORAZEPAM 1 MG PO TABS: 0.0000 mg | ORAL_TABLET | Freq: Four times a day (QID) | ORAL | Status: DC

## 2015-11-27 MED ORDER — NICOTINE 21 MG/24HR TD PT24: 21.0000 mg | MEDICATED_PATCH | Freq: Every day | TRANSDERMAL | Status: DC | PRN

## 2015-11-27 MED ORDER — OXCARBAZEPINE 150 MG PO TABS
150.0000 mg | ORAL_TABLET | Freq: Two times a day (BID) | ORAL | Status: DC
  Administered 2015-11-27 – 2015-11-28 (×2): 150 mg via ORAL
  Filled 2015-11-27 (×2): qty 1

## 2015-11-27 NOTE — ED Triage Notes (Addendum)
Pt reports he has been off his medications and is having SI. Also reports he has been abusing etoh and drugs. Pt brought in by [employer], voluntary. Calm and cooperative in triage. Pt also endorses auditory hallucinations.

## 2015-11-27 NOTE — ED Provider Notes (Signed)
[facility]-EMERGENCY DEPT Provider Note   CSN: [id number] Arrival date & time: 11/27/15  1641     History   Chief Complaint Chief Complaint  Patient presents with  . Suicidal    HPI Chris [name] is a 30 y.o. male.  HPI Patient presents to the emergency department with suicidal ideation.  The patient states that he has been taking his medicines sporadically and using drugs and alcohol and states that he is having increased suicidal ideation over the last few days.  The patient states that he went to his counselor today who advised him he needs to come to the emergency department.  They did involuntarily commit him.  Patient states that he has not attempted any suicidal plans recently.  He states that he did not take any overdose of medication or to inflict harm upon himself.  These had some hallucinations, mostly verbal over the last few daysThe patient denies chest pain, shortness of breath, headache,blurred vision, neck pain, fever, cough, weakness, numbness, dizziness, anorexia, edema, abdominal pain, nausea, vomiting, diarrhea, rash, back pain, dysuria, hematemesis, bloody stool, near syncope, or syncope. Past Medical History:  Diagnosis Date  . Anxiety   . Bipolar 2 disorder (HCC)   . Depression   . Schizophrenia [facility](HCC)     Patient Active Problem List   Diagnosis Date Noted  . Bipolar affective disorder, current episode manic (HCC) 05/05/2012    Class: Acute  . Schizophrenia, paranoid type (HCC) 05/05/2012    Class: Chronic    History reviewed. No pertinent surgical history.     Home Medications    Prior to Admission medications   Medication Sig Start Date End Date Taking? Authorizing Provider  benztropine (COGENTIN) 1 MG tablet Take 1 tablet (1 mg total) by mouth 2 (two) times daily. Patient not taking: Reported on 11/10/2015 06/08/12   Margarita Grizzleanielle Ray, MD  benztropine (COGENTIN) 2 MG tablet Take 2 mg by mouth daily.    Historical Provider, MD  OXcarbazepine (TRILEPTAL) 150  MG tablet Take 1 tablet (150 mg total) by mouth 2 (two) times daily. For mood stabilization. Patient not taking: Reported on 11/10/2015 05/10/12   Tamala JulianNeil T Mashburn, PA-C    Family History Family History  Problem Relation Age of Onset  . Bipolar disorder Other   . Schizophrenia Other     Social History Social History  Substance Use Topics  . Smoking status: Current Every Day Smoker    Packs/day: 0.50  . Smokeless tobacco: Current User  . Alcohol use 1.0 oz/week    2 Standard drinks or equivalent per week     Allergies   Penicillins   Review of Systems Review of Systems All other systems negative except as documented in the HPI. All pertinent positives and negatives as reviewed in the HPI.  Physical Exam Updated Vital Signs There were no vitals taken for this visit.  Physical Exam  Constitutional: He is oriented to person, place, and time. He appears well-developed and well-nourished. No distress.  HENT:  Head: Normocephalic and atraumatic.  Mouth/Throat: Oropharynx is clear and moist.  Eyes: Pupils are equal, round, and reactive to light.  Neck: Normal range of motion. Neck supple.  Cardiovascular: Normal rate, regular rhythm and normal heart sounds.  Exam reveals no gallop and no friction rub.   No murmur heard. Pulmonary/Chest: Effort normal and breath sounds normal. No respiratory distress. He has no wheezes.  Abdominal: Soft. Bowel sounds are normal. He exhibits no distension. There is no tenderness.  Neurological: He is  alert and oriented to person, place, and time. He exhibits normal muscle tone. Coordination normal.  Skin: Skin is warm and dry. No rash noted. No erythema.  Psychiatric: He has a normal mood and affect. His behavior is normal. Judgment normal. His mood appears not anxious. His affect is not angry, not blunt, not labile and not inappropriate. His speech is not rapid and/or pressured and not slurred. He is not agitated, not aggressive and not slowed.  Thought content is not paranoid and not delusional. Cognition and memory are normal. He expresses suicidal ideation. He expresses no homicidal ideation. He expresses no homicidal plans. He is communicative.  Nursing note and vitals reviewed.    ED Treatments / Results  Labs (all labs ordered are listed, but only abnormal results are displayed) Labs Reviewed  BASIC METABOLIC PANEL  CBC WITH DIFFERENTIAL/PLATELET  URINE RAPID DRUG SCREEN, HOSP PERFORMED  SALICYLATE LEVEL  ETHANOL  ACETAMINOPHEN LEVEL    EKG  EKG Interpretation None       Radiology No results found.  Procedures Procedures (including critical care time)  Medications Ordered in ED Medications - No data to display   Initial Impression / Assessment and Plan / ED Course  I have reviewed the triage vital signs and the nursing notes.  Pertinent labs & imaging results that were available during my care of the patient were reviewed by me and considered in my medical decision making (see chart for details).  Clinical Course    Patient will be assessed the TTS for suicidal ideations.  Patient agrees the plan and all questions were answered  Final Clinical Impressions(s) / ED Diagnoses   Final diagnoses:  None    New Prescriptions New Prescriptions   No medications on file     Charlestine Night, PA-C 11/27/15 1706    Samuel Jester, DO 11/28/15 1543

## 2015-11-27 NOTE — ED Notes (Signed)
Pt admitted to room #41. Pt behavior cooperative. Pt reports he came to the hospital d/t "thoughts of killing myself." Pt reports SI with plan to use a knife. Pt reports previous suicide attempts. Pt denies HI. Endorsing AVH. Pt reports "people plotting on me." Pt reports he is homeless. Identifies family as a stressor. Reports his daughter is in foster care. Pt reports he uses alcohol and cocaine weekly. Special checks q 15 mins in place for safety. Video monitoring in place. Will continue to monitor.

## 2015-11-28 ENCOUNTER — Encounter (HOSPITAL_COMMUNITY): Payer: Self-pay | Admitting: *Deleted

## 2015-11-28 ENCOUNTER — Inpatient Hospital Stay (HOSPITAL_COMMUNITY)
Admission: AD | Admit: 2015-11-28 | Discharge: 2015-12-02 | DRG: 885 | Disposition: A | Discharge status: Other Acute Inpatient Hospital | Payer: Medicaid Other | Source: Intra-hospital | Attending: Psychiatry | Admitting: Psychiatry

## 2015-11-28 DIAGNOSIS — F1414 Cocaine abuse with cocaine-induced mood disorder: Secondary | ICD-10-CM | POA: Diagnosis present

## 2015-11-28 DIAGNOSIS — Z818 Family history of other mental and behavioral disorders: Secondary | ICD-10-CM

## 2015-11-28 DIAGNOSIS — F200 Paranoid schizophrenia: Principal | ICD-10-CM | POA: Diagnosis present

## 2015-11-28 DIAGNOSIS — F23 Brief psychotic disorder: Secondary | ICD-10-CM | POA: Diagnosis present

## 2015-11-28 DIAGNOSIS — K0889 Other specified disorders of teeth and supporting structures: Secondary | ICD-10-CM | POA: Clinically undetermined

## 2015-11-28 DIAGNOSIS — F1310 Sedative, hypnotic or anxiolytic abuse, uncomplicated: Secondary | ICD-10-CM | POA: Clinically undetermined

## 2015-11-28 DIAGNOSIS — F1020 Alcohol dependence, uncomplicated: Secondary | ICD-10-CM | POA: Diagnosis present

## 2015-11-28 DIAGNOSIS — R45851 Suicidal ideations: Secondary | ICD-10-CM | POA: Diagnosis present

## 2015-11-28 DIAGNOSIS — F1420 Cocaine dependence, uncomplicated: Secondary | ICD-10-CM | POA: Diagnosis present

## 2015-11-28 DIAGNOSIS — F17210 Nicotine dependence, cigarettes, uncomplicated: Secondary | ICD-10-CM | POA: Diagnosis present

## 2015-11-28 MED ORDER — MAGNESIUM HYDROXIDE 400 MG/5ML PO SUSP: 30.0000 mL | Freq: Every day | ORAL | Status: DC | PRN

## 2015-11-28 MED ORDER — FOLIC ACID 1 MG PO TABS
1.0000 mg | ORAL_TABLET | Freq: Every day | ORAL | Status: DC
  Administered 2015-11-29 – 2015-12-01 (×3): 1 mg via ORAL
  Filled 2015-11-28 (×6): qty 1

## 2015-11-28 MED ORDER — ALUM & MAG HYDROXIDE-SIMETH 200-200-20 MG/5ML PO SUSP: 30.0000 mL | ORAL | Status: DC | PRN

## 2015-11-28 MED ORDER — VITAMIN B-1 100 MG PO TABS
100.0000 mg | ORAL_TABLET | Freq: Every day | ORAL | Status: DC
  Administered 2015-11-29 – 2015-12-01 (×3): 100 mg via ORAL
  Filled 2015-11-28 (×6): qty 1

## 2015-11-28 MED ORDER — BENZTROPINE MESYLATE 0.5 MG PO TABS
0.5000 mg | ORAL_TABLET | Freq: Two times a day (BID) | ORAL | Status: DC
  Administered 2015-11-28 – 2015-12-01 (×7): 0.5 mg via ORAL
  Filled 2015-11-28 (×5): qty 1
  Filled 2015-11-28: qty 28
  Filled 2015-11-28 (×6): qty 1
  Filled 2015-11-28: qty 28
  Filled 2015-11-28: qty 1

## 2015-11-28 MED ORDER — ADULT MULTIVITAMIN W/MINERALS CH
1.0000 | ORAL_TABLET | Freq: Every day | ORAL | Status: DC
  Administered 2015-11-29 – 2015-12-01 (×3): 1 via ORAL
  Filled 2015-11-28 (×6): qty 1

## 2015-11-28 MED ORDER — ACETAMINOPHEN 325 MG PO TABS
650.0000 mg | ORAL_TABLET | Freq: Four times a day (QID) | ORAL | Status: DC | PRN
  Administered 2015-11-29 – 2015-12-01 (×5): 650 mg via ORAL
  Filled 2015-11-28 (×7): qty 2

## 2015-11-28 MED ORDER — OXCARBAZEPINE 150 MG PO TABS
150.0000 mg | ORAL_TABLET | Freq: Two times a day (BID) | ORAL | Status: DC
  Administered 2015-11-28 – 2015-12-01 (×7): 150 mg via ORAL
  Filled 2015-11-28 (×5): qty 1
  Filled 2015-11-28: qty 28
  Filled 2015-11-28 (×3): qty 1
  Filled 2015-11-28: qty 28
  Filled 2015-11-28 (×2): qty 1

## 2015-11-28 MED ORDER — HALOPERIDOL 2 MG PO TABS
2.0000 mg | ORAL_TABLET | Freq: Two times a day (BID) | ORAL | Status: DC
  Administered 2015-11-28 – 2015-12-01 (×7): 2 mg via ORAL
  Filled 2015-11-28 (×4): qty 1
  Filled 2015-11-28: qty 28
  Filled 2015-11-28 (×3): qty 1
  Filled 2015-11-28 (×2): qty 2
  Filled 2015-11-28 (×2): qty 1
  Filled 2015-11-28: qty 28
  Filled 2015-11-28: qty 1

## 2015-11-28 MED ORDER — LORAZEPAM 1 MG PO TABS
0.0000 mg | ORAL_TABLET | Freq: Four times a day (QID) | ORAL | Status: DC
  Administered 2015-11-28 (×2): 2 mg via ORAL
  Administered 2015-11-29: 1 mg via ORAL
  Filled 2015-11-28: qty 1
  Filled 2015-11-28 (×2): qty 2

## 2015-11-28 MED ORDER — LORAZEPAM 2 MG/ML IJ SOLN: 1.0000 mg | Freq: Four times a day (QID) | INTRAMUSCULAR | Status: AC | PRN

## 2015-11-28 MED ORDER — LORAZEPAM 1 MG PO TABS
0.0000 mg | ORAL_TABLET | Freq: Two times a day (BID) | ORAL | Status: DC
  Administered 2015-11-29 – 2015-11-30 (×2): 2 mg via ORAL
  Administered 2015-11-30: 1 mg via ORAL
  Filled 2015-11-28 (×2): qty 2
  Filled 2015-11-28: qty 1

## 2015-11-28 MED ORDER — NICOTINE 21 MG/24HR TD PT24
21.0000 mg | MEDICATED_PATCH | Freq: Every day | TRANSDERMAL | Status: DC
  Administered 2015-11-28 – 2015-12-01 (×3): 21 mg via TRANSDERMAL
  Filled 2015-11-28 (×7): qty 1

## 2015-11-28 MED ORDER — THIAMINE HCL 100 MG/ML IJ SOLN
100.0000 mg | Freq: Every day | INTRAMUSCULAR | Status: DC
  Filled 2015-11-28: qty 2

## 2015-11-28 MED ORDER — HALOPERIDOL 2 MG PO TABS
2.0000 mg | ORAL_TABLET | Freq: Two times a day (BID) | ORAL | Status: DC
  Administered 2015-11-28: 2 mg via ORAL
  Filled 2015-11-28: qty 1

## 2015-11-28 MED ORDER — NICOTINE 21 MG/24HR TD PT24: 21.0000 mg | MEDICATED_PATCH | Freq: Every day | TRANSDERMAL | Status: DC | PRN

## 2015-11-28 MED ORDER — LORAZEPAM 1 MG PO TABS: 1.0000 mg | ORAL_TABLET | Freq: Four times a day (QID) | ORAL | Status: AC | PRN

## 2015-11-28 NOTE — ED Notes (Signed)
Pt discharged ambulatory with [facility] transportation.  All belongings were sent with patient.

## 2015-11-28 NOTE — H&P (Signed)
[place] [facility] Provider Admission PAA/H&P  Patient Identification: Chris [name] MRN:  [id number] Date of Evaluation:  11/28/2015 Chief Complaint:  Patient states "I was having suicidal thoughts so I came to the ED."  Principal Diagnosis: Schizophrenia, paranoid type ([place]) Diagnosis:   Patient Active Problem List   Diagnosis Date Noted  . Cocaine abuse with cocaine-induced mood disorder ([place]) [F14.14] 11/28/2015  . Bipolar affective disorder, current episode manic ([place]) [F31.9] 05/05/2012    Class: Acute  . Schizophrenia, paranoid type ([place]) [F20.0] 05/05/2012    Class: Chronic   History of Present Illness:   Chris [name] is an 30 y.o. male  who presented with suicidal ideation with a plan to kill self with a knife. Patient also admits to paranoid thoughts. He feels [facility] the [facility] to kill him. States experiencing this for the last two days but denies any homicidal thoughts. Patient continues to use alcohol, once or twice a week and cocaine powder once a week. Patient's last admission was in 2014. Patient reports current outpatient treatment at [facility] with Dr. Rosine Door. Per his report receives monthly Invega shots for schizophrenia.  Patient has been homeless for several months, living in abandon buildings.  Due to his drug use his daughter is currently in foster care. Patient became agitated today during assessment after inquiring about options for being homeless stating "All the shelters are full. I need to stay here until Monday. I have nowhere to go. You don't care! Already talking about discharge when I just got here." The patient appears to have been started on Haldol 2 mg bid in the ED to address agitation and paranoia. His urine drug screen is positive for benzo and cocaine. He appears to have limited insight into effects of substance abuse on mental health reporting to writer that Lorayne Bender is making him suicidal. Patient continues to endorse suicidal ideation  with plan to cut himself if released from the hospital. He also reports hearing "people talking to me and thinking that people are going to kill me."   Associated Signs/Symptoms: Depression Symptoms:  depressed mood, hopelessness, recurrent thoughts of death, suicidal thoughts without plan, anxiety, (Hypo) Manic Symptoms:  Irritable Mood, Anxiety Symptoms:  Excessive Worry, Psychotic Symptoms:  Hallucinations: Auditory Paranoia, PTSD Symptoms: Negative Total Time spent with patient: 30 minutes  Past Psychiatric History: Schizophrenia, Cocaine abuse   Is the patient at risk to self? Yes.    Has the patient been a risk to self in the past 6 months? Yes.    Has the patient been a risk to self within the distant past? Yes.    Is the patient a risk to others? No.  Has the patient been a risk to others in the past 6 months? No.  Has the patient been a risk to others within the distant past? No.   Prior Inpatient Therapy:   yes Prior Outpatient Therapy:  yes  Alcohol Screening:   Substance Abuse History in the last 12 months:  Yes.   Consequences of Substance Abuse: Increase in psychotic symptoms  Previous Psychotropic Medications: Yes  Psychological Evaluations: No  Past Medical History:  Past Medical History:  Diagnosis Date  . Anxiety   . Bipolar 2 disorder ([place])   . Depression   . Schizophrenia ([place])    History reviewed. No pertinent surgical history. Family History:  Family History  Problem Relation Age of Onset  . Bipolar disorder Other   . Schizophrenia Other    Family Psychiatric  History: Unable to assess due to acute agitation  Tobacco Screening:   Social History:  History  Alcohol Use  . 1.0 oz/week  . 2 Standard drinks or equivalent per week     History  Drug Use  . Types: Marijuana    Additional Social History:      Pain Medications: none Prescriptions: none Over the Counter: none History of alcohol / drug use?: Yes Negative Consequences of Use:  Personal relationships Withdrawal Symptoms: Agitation, Irritability                    Allergies:   Allergies  Allergen Reactions  . Penicillins Hives and Rash    Has patient had a PCN reaction causing immediate rash, facial/tongue/throat swelling, SOB or lightheadedness with hypotension: YES Has patient had a PCN reaction causing severe rash involving mucus membranes or skin necrosis: YES Has patient had a PCN reaction that required hospitalization NO Has patient had a PCN reaction occurring within the last 10 years:NO If all of the above answers are "NO", then may proceed with Cephalosporin use.    Lab Results:  Results for orders placed or performed during the hospital encounter of 11/27/15 (from the past 48 hour(s))  Basic metabolic panel     Status: Abnormal   Collection Time: 11/27/15  5:05 PM  Result Value Ref Range   Sodium 139 135 - 145 mmol/L   Potassium 3.6 3.5 - 5.1 mmol/L   Chloride 108 101 - 111 mmol/L   CO2 26 22 - 32 mmol/L   Glucose, Bld 111 (H) 65 - 99 mg/dL   BUN 13 6 - 20 mg/dL   Creatinine, Ser 1.03 0.61 - 1.24 mg/dL   Calcium 8.8 (L) 8.9 - 10.3 mg/dL   GFR calc non Af Amer >60 >60 mL/min   GFR calc Af Amer >60 >60 mL/min    Comment: (NOTE) The eGFR has been calculated using the CKD EPI equation. This calculation has not been validated in all clinical situations. eGFR's persistently <60 mL/min signify possible Chronic Kidney Disease.    Anion gap 5 5 - 15  CBC with Differential     Status: Abnormal   Collection Time: 11/27/15  5:05 PM  Result Value Ref Range   WBC 5.9 4.0 - 10.5 K/uL   RBC 4.22 4.22 - 5.81 MIL/uL   Hemoglobin 13.0 13.0 - 17.0 g/dL   HCT 37.1 (L) 39.0 - 52.0 %   MCV 87.9 78.0 - 100.0 fL   MCH 30.8 26.0 - 34.0 pg   MCHC 35.0 30.0 - 36.0 g/dL   RDW 14.1 11.5 - 15.5 %   Platelets 189 150 - 400 K/uL   Neutrophils Relative % 67 %   Neutro Abs 3.9 1.7 - 7.7 K/uL   Lymphocytes Relative 24 %   Lymphs Abs 1.4 0.7 - 4.0 K/uL    Monocytes Relative 8 %   Monocytes Absolute 0.5 0.1 - 1.0 K/uL   Eosinophils Relative 1 %   Eosinophils Absolute 0.1 0.0 - 0.7 K/uL   Basophils Relative 0 %   Basophils Absolute 0.0 0.0 - 0.1 K/uL  Salicylate level     Status: None   Collection Time: 11/27/15  5:05 PM  Result Value Ref Range   Salicylate Lvl <6.3 2.8 - 30.0 mg/dL  Ethanol     Status: None   Collection Time: 11/27/15  5:05 PM  Result Value Ref Range   Alcohol, Ethyl (B) <5 <5 mg/dL    Comment:  LOWEST DETECTABLE LIMIT FOR SERUM ALCOHOL IS 5 mg/dL FOR MEDICAL PURPOSES ONLY   Acetaminophen level     Status: Abnormal   Collection Time: 11/27/15  5:05 PM  Result Value Ref Range   Acetaminophen (Tylenol), Serum <10 (L) 10 - 30 ug/mL    Comment:        THERAPEUTIC CONCENTRATIONS VARY SIGNIFICANTLY. A RANGE OF 10-30 ug/mL MAY BE AN EFFECTIVE CONCENTRATION FOR MANY PATIENTS. HOWEVER, SOME ARE BEST TREATED AT CONCENTRATIONS OUTSIDE THIS RANGE. ACETAMINOPHEN CONCENTRATIONS >150 ug/mL AT 4 HOURS AFTER INGESTION AND >50 ug/mL AT 12 HOURS AFTER INGESTION ARE OFTEN ASSOCIATED WITH TOXIC REACTIONS.   Urine rapid drug screen (hosp performed)     Status: Abnormal   Collection Time: 11/27/15  5:31 PM  Result Value Ref Range   Opiates NONE DETECTED NONE DETECTED   Cocaine POSITIVE (A) NONE DETECTED   Benzodiazepines POSITIVE (A) NONE DETECTED   Amphetamines NONE DETECTED NONE DETECTED   Tetrahydrocannabinol NONE DETECTED NONE DETECTED   Barbiturates NONE DETECTED NONE DETECTED    Comment:        DRUG SCREEN FOR MEDICAL PURPOSES ONLY.  IF CONFIRMATION IS NEEDED FOR ANY PURPOSE, NOTIFY LAB WITHIN 5 DAYS.        LOWEST DETECTABLE LIMITS FOR URINE DRUG SCREEN Drug Class       Cutoff (ng/mL) Amphetamine      1000 Barbiturate      200 Benzodiazepine   595 Tricyclics       638 Opiates          300 Cocaine          300 THC              50     Blood Alcohol level:  Lab Results  Component Value Date   ETH  <5 11/27/2015   ETH <5 75/64/3329    Metabolic Disorder Labs:  No results found for: HGBA1C, MPG No results found for: PROLACTIN No results found for: CHOL, TRIG, HDL, CHOLHDL, VLDL, LDLCALC  Current Medications: Current Facility-Administered Medications  Medication Dose Route Frequency Provider Last Rate Last Dose  . acetaminophen (TYLENOL) tablet 650 mg  650 mg Oral Q6H PRN Patrecia Pour, NP      . alum & mag hydroxide-simeth (MAALOX/MYLANTA) 200-200-20 MG/5ML suspension 30 mL  30 mL Oral PRN Patrecia Pour, NP      . benztropine (COGENTIN) tablet 0.5 mg  0.5 mg Oral BID Niel Hummer, NP      . Derrill Memo ON 08/20/8414] folic acid (FOLVITE) tablet 1 mg  1 mg Oral Daily Patrecia Pour, NP      . haloperidol (HALDOL) tablet 2 mg  2 mg Oral BID Patrecia Pour, NP      . LORazepam (ATIVAN) tablet 1 mg  1 mg Oral Q6H PRN Patrecia Pour, NP       Or  . LORazepam (ATIVAN) injection 1 mg  1 mg Intravenous Q6H PRN Patrecia Pour, NP      . LORazepam (ATIVAN) tablet 0-4 mg  0-4 mg Oral Q6H Patrecia Pour, NP   2 mg at 11/28/15 1219   Followed by  . [START ON 11/29/2015] LORazepam (ATIVAN) tablet 0-4 mg  0-4 mg Oral Q12H Patrecia Pour, NP      . magnesium hydroxide (MILK OF MAGNESIA) suspension 30 mL  30 mL Oral Daily PRN Patrecia Pour, NP      . Derrill Memo ON 11/29/2015] multivitamin with minerals  tablet 1 tablet  1 tablet Oral Daily Patrecia Pour, NP      . nicotine (NICODERM CQ - dosed in mg/24 hours) patch 21 mg  21 mg Transdermal Daily PRN Patrecia Pour, NP      . nicotine (NICODERM CQ - dosed in mg/24 hours) patch 21 mg  21 mg Transdermal Daily Niel Hummer, NP   21 mg at 11/28/15 1219  . OXcarbazepine (TRILEPTAL) tablet 150 mg  150 mg Oral BID Patrecia Pour, NP      . Derrill Memo ON 11/29/2015] thiamine (VITAMIN B-1) tablet 100 mg  100 mg Oral Daily Patrecia Pour, NP       Or  . Derrill Memo ON 11/29/2015] thiamine (B-1) injection 100 mg  100 mg Intravenous Daily Patrecia Pour, NP       PTA  Medications: Prescriptions Prior to Admission  Medication Sig Dispense Refill Last Dose  . benztropine (COGENTIN) 1 MG tablet Take 1 tablet (1 mg total) by mouth 2 (two) times daily. 14 tablet 0 Past Month at Unknown time  . benztropine (COGENTIN) 2 MG tablet Take 2 mg by mouth daily.   Past Month at Unknown time  . OXcarbazepine (TRILEPTAL) 150 MG tablet Take 1 tablet (150 mg total) by mouth 2 (two) times daily. For mood stabilization. (Patient not taking: Reported on 11/10/2015) 60 tablet 0 Unknown at Unknown time    Musculoskeletal: Strength & Muscle Tone: within normal limits Gait & Station: normal Patient leans: N/A  Psychiatric Specialty Exam: Physical Exam  Review of Systems  Psychiatric/Behavioral: Positive for depression, hallucinations, substance abuse and suicidal ideas. The patient is nervous/anxious.     Blood pressure 121/80, pulse 71, temperature 98.4 F (36.9 C), temperature source Oral, resp. rate 18, height 6' (1.829 m), weight 72.6 kg (160 lb), SpO2 100 %.Body mass index is 21.7 kg/m.  General Appearance: Disheveled  Eye Contact:  Fair  Speech:  Clear and Coherent and Pressured  Volume:  Varies increased upon becoming agitated   Mood:  Angry  Affect:  Labile  Thought Process:  Coherent  Orientation:  Full (Time, Place, and Person)  Thought Content:  Hallucinations: Auditory and Paranoid Ideation  Suicidal Thoughts:  Yes.  with intent/plan  Homicidal Thoughts:  No  Memory:  Immediate;   Fair Recent;   Poor Remote;   Poor  Judgement:  Poor  Insight:  Lacking  Psychomotor Activity:  Increased  Concentration:  Concentration: Fair and Attention Span: Fair  Recall:  [employer] of Knowledge:  Fair  Language:  Fair  Akathisia:  No  Handed:  Right  AIMS (if indicated):     Assets:  Communication Skills Desire for Improvement Physical Health Resilience  ADL's:  Intact  Cognition:  Impaired,  Mild  Sleep:         Treatment Plan Summary: Daily contact  with patient to assess and evaluate symptoms and progress in treatment and Medication management  Observation Level/Precautions:  Continuous Observation Laboratory:  CBC Chemistry Profile UDS Psychotherapy: Individual  Medications:  Start Haldol 2 mg bid with cogentin 0.5 mg bid for psychosis/EPS prevention Consultations:  None Discharge Concerns:  Safety and stability  Estimated LOS: 24-48 hours Other:      Elmarie Shiley, NP 8/26/20175:04 PM   Agree with NP note and assessment

## 2015-11-28 NOTE — BH Assessment (Signed)
Tele Assessment Note   Chris ChampagneZachary [name] is an 30 y.o. male.   Patient is [age]yr old male who presents with suicidal ideation with a plan to kill self with a knife. Patient also admits to paranoid thoughts. He [facility]. States experiencing this for the last 2 days. No Hi.. Patient continues to use alcohol, once or twice a week and cocaine powder once a week. Patient's last admission was in 2014. Has not been going to outpatient treatment, [facility] [address]. Was receiving IM meds.   Patient has been homeless for several months, living in abandon buildings. The Patient  States he was stable for a while when he had a girlfriend and lived with her. Due to his drug use his daughter is currently in foster care.   Patient was oriented, fair eye contact, somewhat suspicious, poor insight, poor appetite, poor sleep. Patient was positive for Benzo and Cocaine.   Denies criminal charges or court dates.   This clinician spoke with Tawni LevyJameson Lord, NP who agreed to accept for OBS bed.     Diagnosis: Schizophrenia  Past Medical History:  Past Medical History:  Diagnosis Date  . Anxiety   . Bipolar 2 disorder (HCC)   . Depression   . Schizophrenia (HCC)     History reviewed. No pertinent surgical history.  Family History:  Family History  Problem Relation Age of Onset  . Bipolar disorder Other   . Schizophrenia Other     Social History:  reports that he has been smoking.  He has been smoking about 0.50 packs per day. He uses smokeless tobacco. He reports that he drinks about 1.0 oz of alcohol per week . He reports that he uses drugs, including Marijuana.  Additional Social History:  Alcohol / Drug Use Pain Medications: see MAR Prescriptions: see MAR Over the Counter: see MAR History of alcohol / drug use?: Yes Longest period of sobriety (when/how long): unknown Negative Consequences of Use: Personal relationships, Financial Substance #1 Name of Substance 1:  (S) Cocaine 1 - Age of First Use: unknown 1 - Amount (size/oz): powder 1 - Frequency: weekly 1 - Duration: months 1 - Last Use / Amount: last week Substance #2 Name of Substance 2: alcohol 2 - Amount (size/oz): 1  40oz. 2 - Frequency: 1 or 2 times a week 2 - Duration: months 2 - Last Use / Amount: several days ago  CIWA: CIWA-Ar BP: 105/66 Pulse Rate: 85 Nausea and Vomiting: no nausea and no vomiting Tactile Disturbances: none Tremor: no tremor Auditory Disturbances: not present Paroxysmal Sweats: no sweat visible Visual Disturbances: not present Anxiety: no anxiety, at ease Headache, Fullness in Head: none present Agitation: normal activity Orientation and Clouding of Sensorium: oriented and can do serial additions CIWA-Ar Total: 0 COWS:    PATIENT STRENGTHS: (choose at least two) Average or above average intelligence Capable of independent living  Allergies:  Allergies  Allergen Reactions  . Penicillins Hives and Rash    Has patient had a PCN reaction causing immediate rash, facial/tongue/throat swelling, SOB or lightheadedness with hypotension: YES Has patient had a PCN reaction causing severe rash involving mucus membranes or skin necrosis: YES Has patient had a PCN reaction that required hospitalization NO Has patient had a PCN reaction occurring within the last 10 years:NO If all of the above answers are "NO", then may proceed with Cephalosporin use.     Home Medications:  (Not in a hospital admission)  OB/GYN Status:  No LMP for male patient.  General Assessment Data [facility] of Assessment: [facility] TTS Assessment: In system Is this a Tele or Face-to-Face Assessment?: Face-to-Face Is this an Initial Assessment or a Re-assessment for this encounter?: Initial Assessment Marital status: Single Is patient pregnant?: No Pregnancy Status: No Living Arrangements: Other (Comment) (homeless) Can pt return to current living arrangement?: Yes Admission Status:  Voluntary Is patient capable of signing voluntary admission?: Yes Referral Source: Self/Family/Friend Insurance type: Geophysical data processor Exam [facility] Walk-in ONLY) Medical Exam completed: Yes  Crisis Care Plan Living Arrangements: Other (Comment) (homeless) Name of Psychiatrist: Dr Omelia Blackwater ([facility]) (has not seen in a while, was on IM meds)  Education Status Is patient currently in school?: No  Risk to self with the past 6 months Suicidal Ideation: Yes-Currently Present Has patient been a risk to self within the past 6 months prior to admission? : No Suicidal Intent: Yes-Currently Present (kill self with a knife) Has patient had any suicidal intent within the past 6 months prior to admission? : No Is patient at risk for suicide?: Yes Suicidal Plan?:  (no specific plan currently, states he cant be safe ) Has patient had any suicidal plan within the past 6 months prior to admission? : No Access to Means: No What has been your use of drugs/alcohol within the last 12 months?: weekly use of alcohol and cocaine Previous Attempts/Gestures: Yes How many times?:  (numerous) Other Self Harm Risks:  (hitting head) Triggers for Past Attempts: Unknown Intentional Self Injurious Behavior: Cutting Family Suicide History: Unknown Recent stressful life event(s): Other (Comment) (homeless, off meds) Persecutory voices/beliefs?: Yes Depression: Yes Depression Symptoms: Feeling angry/irritable Substance abuse history and/or treatment for substance abuse?: Yes Suicide prevention information given to non-admitted patients: Not applicable  Risk to Others within the past 6 months Homicidal Ideation: No Does patient have any lifetime risk of violence toward others beyond the six months prior to admission? : No Thoughts of Harm to Others: No Current Homicidal Intent: No Current Homicidal Plan: No Access to Homicidal Means: No History of harm to others?: No Does  patient have access to weapons?: No Criminal Charges Pending?: No Does patient have a court date: No Is patient on probation?: No  Psychosis Hallucinations: None noted Delusions:  (possible/ believes people want to kill him)  Mental Status Report Appearance/Hygiene: Poor hygiene, In scrubs Eye Contact: Fair Motor Activity: Unremarkable Speech: Unremarkable Level of Consciousness: Alert Mood: Suspicious Affect: Irritable Anxiety Level: Moderate Thought Processes: Relevant Judgement: Impaired Orientation: Place, Person, Time, Situation Obsessive Compulsive Thoughts/Behaviors: None  Cognitive Functioning Concentration: Normal Memory: Recent Intact, Remote Intact IQ: Average Insight: Good Impulse Control: Fair Appetite: Poor Sleep: Decreased  ADLScreening [facility] Assessment Services) Patient's cognitive ability adequate to safely complete daily activities?: Yes Patient able to express need for assistance with ADLs?: Yes Independently performs ADLs?: Yes (appropriate for developmental age)  Prior Inpatient Therapy Prior Inpatient Therapy: Yes Prior Therapy Dates: 2014 Prior Therapy Facilty/Provider(s): [facility] Reason for Treatment: Schizophrenia  Prior Outpatient Therapy Prior Outpatient Therapy: Yes Prior Therapy Dates:  (none in months) Prior Therapy Facilty/Provider(s):  ([facility]) Reason for Treatment: Diagnosis, need IM meds Does patient have an ACCT team?: No Does patient have Intensive In-House Services?  : No Does patient have [facility] services? : No Does patient have P4CC services?: No  ADL Screening (condition at time of admission) Patient's cognitive ability adequate to safely complete daily activities?: Yes Is the patient deaf or have  difficulty hearing?: No Does the patient have difficulty seeing, even when wearing glasses/contacts?: No Does the patient have difficulty concentrating, remembering, or making decisions?: Yes Patient able to  express need for assistance with ADLs?: Yes Does the patient have difficulty dressing or bathing?: No Independently performs ADLs?: Yes (appropriate for developmental age) Does the patient have difficulty walking or climbing stairs?: No Weakness of Legs: None Weakness of Arms/Hands: None  Home Assistive Devices/Equipment Home Assistive Devices/Equipment: None  Therapy Consults (therapy consults require a physician order) PT Evaluation Needed: No OT Evalulation Needed: No SLP Evaluation Needed: No       Advance Directives (For Healthcare) Does patient have an advance directive?: No Would patient like information on creating an advanced directive?: No - patient declined information    Additional Information 1:1 In Past 12 Months?: No CIRT Risk: No Elopement Risk: No Does patient have medical clearance?: No     Disposition:  Disposition Initial Assessment Completed for this Encounter: Yes Disposition of Patient: Other dispositions (OBS/ Tawni Levy, NP agreed to OBS) Other disposition(s): Other (Comment) (OBS)  Vonzell Schlatter [facility] 11/28/2015 9:52 AM

## 2015-11-28 NOTE — BH Assessment (Signed)
BHH Assessment Progress Note   This clinician spoke with Jameson Lord, NP who agreed to accept for OBS bed.         

## 2015-11-28 NOTE — Plan of Care (Signed)
[facility] Observation Crisis Plan  Reason for Crisis Plan:  Substance Abuse   Plan of Care:  Referral for Substance Abuse  Family Support:      Current Living Environment:  Living Arrangements: Other (Comment) (homeless)  Insurance:   Hospital Account    Name Acct ID Class Status Primary Coverage   Kerrin ChampagneKnight, Jefrey [id number] BEHAVIORAL HEALTH OBSERVATION Open [place]        Guarantor Account (for Hospital Account [id number])    Name Relation to Pt Service Area Active? Acct Type   Kerrin ChampagneKnight, Dreshon Self [facility]SA Yes [facility]   Address Phone       [address] [place], [place] [address] [phone])          Coverage Information (for Hospital Account [id number])    F/O Payor/Plan Precert #   [facility]    Subscriber Subscriber #   Kerrin ChampagneKnight, Arlander [id number]   Address Phone   [address] [place], [place] [address] [phone]      Legal Guardian:     Primary Care Provider:  No PCP Per Patient  Current Outpatient Providers:  Dr. Penelope GalasHeading  Psychiatrist:     Counselor/Therapist:     Compliant with Medications:  Yes  Additional Information:   Buford DresserForrest, Tineka Uriegas Shanta 8/26/20171:37 PM

## 2015-11-28 NOTE — BH Assessment (Signed)
BHH Assessment Progress Note   This clinician spoke with Chris LevyJameson Lord, NP who agreed to accept for OBS bed.

## 2015-11-28 NOTE — Progress Notes (Signed)
Patient ID: Kerrin ChampagneZachary Penaloza, male   DOB: [birth year], 30 y.o.   MRN: [id number]   30 year old black male [facility] and unable to contract for safety. [facility] that he was thoughts of killing himself come and go. Pt reported that he was very depressed and that he was just tired of living. Pt reported that his depression was a 7, his hopelessness was a 3, and his anxiety was a 4. Pt reported that he was homeless, with no where to go. Pt reported that in the community he sees Dr. Penelope GalasHeading, who gives him his Hinda Glatternvega shot that he just got three weeks ago. Pt reported that he smokes a pack of cigarettes aday, that he drinks alcohol occasionally, and that he uses cocaine on the weekend "about a 20". Pt admitted into the obs unit.

## 2015-11-29 DIAGNOSIS — F23 Brief psychotic disorder: Secondary | ICD-10-CM | POA: Diagnosis present

## 2015-11-29 DIAGNOSIS — K0889 Other specified disorders of teeth and supporting structures: Secondary | ICD-10-CM | POA: Diagnosis present

## 2015-11-29 DIAGNOSIS — F1420 Cocaine dependence, uncomplicated: Secondary | ICD-10-CM | POA: Diagnosis present

## 2015-11-29 DIAGNOSIS — Z818 Family history of other mental and behavioral disorders: Secondary | ICD-10-CM | POA: Diagnosis not present

## 2015-11-29 DIAGNOSIS — F1020 Alcohol dependence, uncomplicated: Secondary | ICD-10-CM | POA: Diagnosis present

## 2015-11-29 DIAGNOSIS — F200 Paranoid schizophrenia: Secondary | ICD-10-CM | POA: Diagnosis present

## 2015-11-29 DIAGNOSIS — F1310 Sedative, hypnotic or anxiolytic abuse, uncomplicated: Secondary | ICD-10-CM | POA: Diagnosis present

## 2015-11-29 DIAGNOSIS — F17210 Nicotine dependence, cigarettes, uncomplicated: Secondary | ICD-10-CM | POA: Diagnosis not present

## 2015-11-29 DIAGNOSIS — R45851 Suicidal ideations: Secondary | ICD-10-CM | POA: Diagnosis not present

## 2015-11-29 NOTE — Progress Notes (Signed)
D: Pt denies SI/HI/AV. Pt is pleasant and cooperative. Patient  voices no concerns. A: Pt was offered support and encouragement. Pt was given scheduled medications. Continued observation for safety and patient remains safe on unit.  R: Pt is taking medication. Pt has no complaints.Pt receptive to treatment and safety maintained on unit.

## 2015-11-29 NOTE — Progress Notes (Signed)
Patient ID: Chris ChampagneZachary [name], male   DOB: 29-Jul-1985, 29 y.o.   MRN: [id number] Seen this am by Fransisca KaufmannLaura Davis NP. And she has determined he will be transferred to the adult unit to bed 508-2. He is happy to be moving to the unit. He has been calm and cooperative while in the OBS unit. He is here for suicidal ideation with a lan to kill self with a knife. He is believing [facility], and he continues to maintain that believe today. His drug screen was positive for cocaine and benzos, and normal for alcohol thought he states he drinks once or twice a week. No sx of detox this shift, CIWA at 1130 was 0. Reported off to [employer]rea RN for the transfer to the adult unit.

## 2015-11-29 NOTE — Progress Notes (Signed)
Patient assessed in the Observation Unit this morning. He continues to express auditory hallucinations and paranoia. Patient states "The voices tell me to kill people in my [facility] at [facility]. I'm sure that [facility]. I think they would kill me if I were not here in the hospital." Chris [name] has a history of schizophrenia. Due to the presence of psychotic symptoms that patient meet's criteria for inpatient admission. He has been accepted to 508-2 and [facility].

## 2015-11-29 NOTE — Tx Team (Signed)
Initial Treatment Plan 11/29/2015 5:32 PM Chris [name] [id number]    PATIENT STRESSORS: Financial difficulties Medication change or noncompliance Substance abuse   PATIENT STRENGTHS: General fund of knowledge Motivation for treatment/growth   PATIENT IDENTIFIED PROBLEMS: Suicide Risk  Paranoia  Depressed Mood                 DISCHARGE CRITERIA:  Improved stabilization in mood, thinking, and/or behavior Need for constant or close observation no longer present Verbal commitment to aftercare and medication compliance  PRELIMINARY DISCHARGE PLAN: Attend aftercare/continuing care group Outpatient therapy Placement in alternative living arrangements  PATIENT/FAMILY INVOLVEMENT: This treatment plan has been presented to and reviewed with the patient, Chris [name].  The patient and family have been given the opportunity to ask questions and make suggestions.  Lauris PoagAndrea B Rehman Levinson, RN 11/29/2015, 5:32 PM

## 2015-11-30 ENCOUNTER — Encounter (HOSPITAL_COMMUNITY): Payer: Self-pay | Admitting: Psychiatry

## 2015-11-30 DIAGNOSIS — F1420 Cocaine dependence, uncomplicated: Secondary | ICD-10-CM | POA: Diagnosis present

## 2015-11-30 DIAGNOSIS — K0889 Other specified disorders of teeth and supporting structures: Secondary | ICD-10-CM | POA: Clinically undetermined

## 2015-11-30 DIAGNOSIS — F1310 Sedative, hypnotic or anxiolytic abuse, uncomplicated: Secondary | ICD-10-CM | POA: Clinically undetermined

## 2015-11-30 DIAGNOSIS — R45851 Suicidal ideations: Secondary | ICD-10-CM

## 2015-11-30 DIAGNOSIS — F200 Paranoid schizophrenia: Secondary | ICD-10-CM | POA: Diagnosis present

## 2015-11-30 DIAGNOSIS — F1020 Alcohol dependence, uncomplicated: Secondary | ICD-10-CM | POA: Diagnosis present

## 2015-11-30 MED ORDER — BENZOCAINE 10 % MT GEL
Freq: Four times a day (QID) | OROMUCOSAL | Status: DC | PRN
  Administered 2015-11-30 – 2015-12-01 (×2): via OROMUCOSAL
  Filled 2015-11-30 (×2): qty 9.4

## 2015-11-30 MED ORDER — CLINDAMYCIN HCL 300 MG PO CAPS
300.0000 mg | ORAL_CAPSULE | Freq: Three times a day (TID) | ORAL | Status: DC
  Administered 2015-11-30 – 2015-12-02 (×6): 300 mg via ORAL
  Filled 2015-11-30: qty 1
  Filled 2015-11-30: qty 2
  Filled 2015-11-30 (×8): qty 1

## 2015-11-30 MED ORDER — IBUPROFEN 800 MG PO TABS
800.0000 mg | ORAL_TABLET | Freq: Once | ORAL | Status: AC
  Administered 2015-11-30: 800 mg via ORAL
  Filled 2015-11-30 (×2): qty 1

## 2015-11-30 NOTE — Progress Notes (Signed)
Recreation Therapy Notes  INPATIENT RECREATION THERAPY ASSESSMENT  Patient Details Name: Chris [name] MRN: [id number] DOB: Jun 28, 1985 Today's Date: 11/30/2015  Patient Stressors: Relationship, Other (Comment) (Housing, paranoid)  Pt stated he was here S/I and bipolar.  Coping Skills:   Isolate, Substance Abuse, Avoidance, Self-Injury, Music, Sports  Personal Challenges: Anger, Communication, Relationships, Self-Esteem/Confidence, Stress Management, Substance Abuse, Work Nutritional therapisterformance  Leisure Interests (2+):  Individual - TV, Individual - Other (Comment) (Eat)  Awareness of Community Resources:  Yes  Community Resources:  Library, [facility], [facility]  Current Use: Yes  Patient Strengths:  "Play drums, relationship with God"  Patient Identified Areas of Improvement:  "Coping skills; people, places and things"  Current Recreation Participation:  Everyday  Patient Goal for Hospitalization:  "[facility], eat healthy and get weight up"  [place]  [place] Residence[place]   Current [place] (including self-harm):  No  Current HI:  No  Consent to Intern Participation: N/A  Caroll RancherMarjette Brittany Osier, LRT/CTRS  Caroll RancherLindsay, Maryjo Ragon A 11/30/2015, 3:11 PM

## 2015-11-30 NOTE — BHH Group Notes (Signed)
BHH LCSW Group Therapy  11/30/2015 1:15 pm  Type of Therapy: Process Group Therapy  Participation Level:  Active  Participation Quality:  Appropriate  Affect:  Flat  Cognitive:  Oriented  Insight:  Improving  Engagement in Group:  Limited  Engagement in Therapy:  Limited  Modes of Intervention:  Activity, Clarification, Education, Problem-solving and Support  Summary of Progress/Problems: Today's group addressed the issue of overcoming obstacles.  Patients were asked to identify their biggest obstacle post d/c that stands in the way of their on-going success, and then problem solve as to how to manage this. Stayed the entire time, minimal engagement.  "My biggest obstacle is staying on meds."  This despite the overriding issue that he is trying to get into a drug rehab program.  When asked about this he minimized, stating that his Outpt worker is trying to [facility]" and his doing it because she thinks it is a good idea.  Limited insight.  Daryel Geraldorth, Gladine Plude B 11/30/2015   3:11 PM

## 2015-11-30 NOTE — Progress Notes (Signed)
Adult Psychoeducational Group Note  Date:  11/30/2015 Time:  9:18 PM  Group Topic/Focus:  Wrap-Up Group:   The focus of this group is to help patients review their daily goal of treatment and discuss progress on daily workbooks.   Participation Level:  Active  Participation Quality:  Appropriate  Affect:  Appropriate  Cognitive:  Appropriate  Insight: Appropriate  Engagement in Group:  Engaged  Modes of Intervention:  Discussion  Additional Comments:  The patient expressed that he learned a lot in group.The patient also said that he rates today a 7. Chris [name], Chris [name] 11/30/2015, 9:18 PM

## 2015-11-30 NOTE — Progress Notes (Signed)
Chris [name]. Chris [name] initially was irritable and  spoke about needing to be discharged and spoke about how he has a place to go. It was explained to him that he wanted to be here and that he needed a place to stay and was informed that he could tell his doctor and case manager know in the morning about having a place to stay and seemed content with this information. Chris [name] did receive his bedtime medication without incident and did receive some tylenol for a toothache before retiring to his room for the evening. A. Support and encouragement provided. R. Safety maintained,will continue to monitor.

## 2015-11-30 NOTE — [facility] Suicide Risk Assessment (Signed)
[facility]   Nursing information obtained from:  Patient Demographic factors:  Male Current Mental Status:  Suicidal ideation indicated by patient Loss Factors:  Financial problems / change in socioeconomic status Historical Factors:  NA Risk Reduction Factors:  Religious beliefs about death  Total Time spent with patient: 30 minutes Principal Problem: Paranoid schizophrenia (HCC) Diagnosis:   Patient Active Problem List   Diagnosis Date Noted  . Paranoid schizophrenia (HCC) [F20.0] 11/30/2015  . Cocaine use disorder, moderate, dependence (HCC) [F14.20] 11/30/2015  . Alcohol use disorder, moderate, dependence (HCC) [F10.20] 11/30/2015  . Tooth ache [K08.89] 11/30/2015   Subjective Data: Please see H&P.   Continued Clinical Symptoms:  Alcohol Use Disorder Identification Test Final Score (AUDIT): 14 The "Alcohol Use Disorders Identification Test", Guidelines for Use in Primary Care, Second Edition.  World Science writerHealth Organization [facility](WHO). Score between 0-7:  no or low risk or alcohol related problems. Score between 8-15:  moderate risk of alcohol related problems. Score between 16-19:  high risk of alcohol related problems. Score 20 or above:  warrants further diagnostic evaluation for alcohol dependence and treatment.   CLINICAL FACTORS:   Alcohol/Substance Abuse/Dependencies Previous Psychiatric Diagnoses and Treatments   Musculoskeletal: Strength & Muscle Tone: within normal limits Gait & Station: normal Patient leans: N/A  Psychiatric Specialty Exam: Physical Exam  Nursing note and vitals reviewed.   Review of Systems  Psychiatric/Behavioral: Positive for depression. The patient is nervous/anxious.   All other systems reviewed and are negative.   Blood pressure 112/68, pulse 86, temperature 98.8 F (37.1 C), temperature source Oral, resp. rate 18, height 5\' 11"  (1.803 m), weight 70.1 kg (154 lb 8 oz), SpO2 100 %.Body mass index is 21.55 kg/m.   Please see H&P.                                                         COGNITIVE FEATURES THAT CONTRIBUTE TO RISK:  Closed-mindedness, Polarized thinking and Thought constriction (tunnel vision)    SUICIDE RISK:   Moderate:  Frequent suicidal ideation with limited intensity, and duration, some specificity in terms of plans, no associated intent, good self-control, limited dysphoria/symptomatology, some risk factors present, and identifiable protective factors, including available and accessible social support.   PLAN OF CARE: Please see H&P.   I certify that inpatient services furnished can reasonably be expected to improve the patient's condition.  Odessia Asleson, MD 11/30/2015, 11:52 AM

## 2015-11-30 NOTE — H&P (Signed)
Psychiatric Admission Assessment Adult  Patient Identification: Chris [name] MRN:  [id number] Date of Evaluation:  11/30/2015 Chief Complaint: Pt states " I am afraid of the people at [facility] .'   Principal Diagnosis: Paranoid schizophrenia (HCC) Diagnosis:   Patient Active Problem List   Diagnosis Date Noted  . Paranoid schizophrenia (HCC) [F20.0] 11/30/2015  . Cocaine use disorder, moderate, dependence (HCC) [F14.20] 11/30/2015  . Alcohol use disorder, moderate, dependence (HCC) [F10.20] 11/30/2015  . Tooth ache [K08.89] 11/30/2015   History of Present Illness: Chris [name] a 30 y.o.[name] who is single , unemployed , who has a hx of schizophrenia as well as alcohol and cocaine abuse, who presented with suicidal ideation with a plan to kill self with a knife.   Per initial notes in EHR : ' Patient  admits to paranoid thoughts. He feels people at the [facility] want to kill him. States experiencing this for the last two days but denies any homicidal thoughts. Patient continues to use alcohol, once or twice a week and cocaine powder once a week. Patient's last admission was in 2014. Patient reports current outpatient treatment at [facility] with Dr. Omelia Blackwater. Per his report receives monthly Invega shots for schizophrenia. Patient has been homeless for several months, living in abandon buildings.  Due to his drug use his daughter is currently in foster care. His urine drug screen is positive for benzo and cocaine. He appears to have limited insight into effects of substance abuse on mental health reporting to writer that [name] is making him suicidal. Patient continues to endorse suicidal ideation with plan to cut himself if released from the hospital. He also reports hearing "people talking to me and thinking that people are going to kill me."  Patient seen and chart reviewed TODAY .Discussed patient with treatment team. Patient today continues to appear paranoid, delusional, but  attempts to minimize his sx and denies any new concern. Pt reports he has been abusing alcohol on a regular basis and also cocaine as mentioned above  , but did not mention BZD. His UDS is positive for cocaine, BZD.Marland Kitchen Pt follows up with Dr.Headen and is focussed on getting discharged today. Pt reports he is on LAI - Invega sustenna IM - which he received few weeks ago . CSW to contact out patient provider to clarify this .  Associated Signs/Symptoms: Depression Symptoms:  depressed mood, difficulty concentrating, (Hypo) Manic Symptoms:  Delusions, Impulsivity, Irritable Mood, Anxiety Symptoms:  Excessive Worry, Psychotic Symptoms:  Delusions, Paranoia, PTSD Symptoms: Negative Total Time spent with patient: 45 minutes  Past Psychiatric History: Patient was admitted to [facility] in 2014, diagnosis at that time was Bipolar affective do and alcohol abuse. Pt follows up with DR.Headen. Pt reports several admissions to IP mental health facilities.  Is the patient at risk to self? Yes.    Has the patient been a risk to self in the past 6 months? No.  Has the patient been a risk to self within the distant past? Yes.    Is the patient a risk to others? Yes.    Has the patient been a risk to others in the past 6 months? No.  Has the patient been a risk to others within the distant past? Yes.     Prior Inpatient Therapy:  yes Prior Outpatient Therapy:  see above  Alcohol Screening: 1. How often do you have a drink containing alcohol?: 2 to 3 times a week 2. How many drinks containing alcohol do you have  on a typical day when you are drinking?: 1 or 2 3. How often do you have six or more drinks on one occasion?: Less than monthly Preliminary Score: 1 4. How often during the last year have you found that you were not able to stop drinking once you had started?: Monthly 5. How often during the last year have you failed to do what was normally expected from you becasue of drinking?: Monthly 6. How often  during the last year have you needed a first drink in the morning to get yourself going after a heavy drinking session?: Never 7. How often during the last year have you had a feeling of guilt of remorse after drinking?: Weekly 8. How often during the last year have you been unable to remember what happened the night before because you had been drinking?: Less than monthly 9. Have you or someone else been injured as a result of your drinking?: Yes, but not in the last year 10. Has a relative or friend or a doctor or another health worker been concerned about your drinking or suggested you cut down?: No Alcohol Use Disorder Identification Test Final Score (AUDIT): 14 Brief Intervention: Yes Substance Abuse History in the last 12 months:  Yes.   Consequences of Substance Abuse: Medical Consequences:  several admissions to IP units Previous Psychotropic Medications: Yes trilpetal, seroquel Psychological Evaluations: No  Past Medical History:  Past Medical History:  Diagnosis Date  . Anxiety   . Bipolar 2 disorder (HCC)   . Depression   . Schizophrenia (HCC)    History reviewed. No pertinent surgical history. Family History:denied hx of HTN, DM,Thyroid disease to family.  Family History  Problem Relation Age of Onset  . Bipolar disorder Other   . Schizophrenia Other    Family Psychiatric  History:see above  Tobacco Screening: Have you used any form of tobacco in the last 30 days? (Cigarettes, Smokeless Tobacco, Cigars, and/or Pipes): Yes Tobacco use, Select all that apply: 5 or more cigarettes per day Are you interested in Tobacco Cessation Medications?: Yes, will notify MD for an order Counseled patient on smoking cessation including recognizing danger situations, developing coping skills and basic information about quitting provided: Refused/Declined practical counseling Social History:  History  Alcohol Use  . 1.0 oz/week  . 2 Standard drinks or equivalent per week     History   Drug Use  . Types: Marijuana    Additional Social History:      Pain Medications: none Prescriptions: none Over the Counter: none History of alcohol / drug use?: Yes Negative Consequences of Use: Personal relationships Withdrawal Symptoms: Agitation, Irritability                    Allergies:   Allergies  Allergen Reactions  . Penicillins Hives and Rash    Has patient had a PCN reaction causing immediate rash, facial/tongue/throat swelling, SOB or lightheadedness with hypotension: YES Has patient had a PCN reaction causing severe rash involving mucus membranes or skin necrosis: YES Has patient had a PCN reaction that required hospitalization NO Has patient had a PCN reaction occurring within the last 10 years:NO If all of the above answers are "NO", then may proceed with Cephalosporin use.    Lab Results: No results found for this or any previous visit (from the past 48 hour(s)).  Blood Alcohol level:  Lab Results  Component Value Date   [facility] 11/27/2015   ETH <5 11/10/2015    Metabolic Disorder  Labs:  No results found for: HGBA1C, MPG No results found for: PROLACTIN No results found for: CHOL, TRIG, HDL, CHOLHDL, VLDL, LDLCALC  Current Medications: Current Facility-Administered Medications  Medication Dose Route Frequency Provider Last Rate Last Dose  . acetaminophen (TYLENOL) tablet 650 mg  650 mg Oral Q6H PRN Charm Rings, NP   650 mg at 11/30/15 1610  . alum & mag hydroxide-simeth (MAALOX/MYLANTA) 200-200-20 MG/5ML suspension 30 mL  30 mL Oral PRN Charm Rings, NP      . benzocaine (ORAJEL) 10 % mucosal gel   Mouth/Throat QID PRN Jomarie Longs, MD      . benztropine (COGENTIN) tablet 0.5 mg  0.5 mg Oral BID Thermon Leyland, NP   0.5 mg at 11/30/15 0804  . clindamycin (CLEOCIN) capsule 300 mg  300 mg Oral Q8H Shannara Winbush, MD      . folic acid (FOLVITE) tablet 1 mg  1 mg Oral Daily Charm Rings, NP   1 mg at 11/30/15 9604  . haloperidol (HALDOL)  tablet 2 mg  2 mg Oral BID Charm Rings, NP   2 mg at 11/30/15 0804  . LORazepam (ATIVAN) tablet 1 mg  1 mg Oral Q6H PRN Charm Rings, NP       Or  . LORazepam (ATIVAN) injection 1 mg  1 mg Intravenous Q6H PRN Charm Rings, NP      . LORazepam (ATIVAN) tablet 0-4 mg  0-4 mg Oral Q12H Charm Rings, NP   2 mg at 11/30/15 0804  . magnesium hydroxide (MILK OF MAGNESIA) suspension 30 mL  30 mL Oral Daily PRN Charm Rings, NP      . multivitamin with minerals tablet 1 tablet  1 tablet Oral Daily Charm Rings, NP   1 tablet at 11/30/15 0804  . nicotine (NICODERM CQ - dosed in mg/24 hours) patch 21 mg  21 mg Transdermal Daily PRN Charm Rings, NP      . nicotine (NICODERM CQ - dosed in mg/24 hours) patch 21 mg  21 mg Transdermal Daily Thermon Leyland, NP   21 mg at 11/30/15 0805  . OXcarbazepine (TRILEPTAL) tablet 150 mg  150 mg Oral BID Charm Rings, NP   150 mg at 11/30/15 0804  . thiamine (VITAMIN B-1) tablet 100 mg  100 mg Oral Daily Charm Rings, NP   100 mg at 11/30/15 5409   Or  . thiamine (B-1) injection 100 mg  100 mg Intravenous Daily Charm Rings, NP       PTA Medications: Prescriptions Prior to Admission  Medication Sig Dispense Refill Last Dose  . benztropine (COGENTIN) 1 MG tablet Take 1 tablet (1 mg total) by mouth 2 (two) times daily. 14 tablet 0 Past Month at Unknown time  . benztropine (COGENTIN) 2 MG tablet Take 2 mg by mouth daily.   Past Month at Unknown time  . OXcarbazepine (TRILEPTAL) 150 MG tablet Take 1 tablet (150 mg total) by mouth 2 (two) times daily. For mood stabilization. (Patient not taking: Reported on 11/10/2015) 60 tablet 0 Unknown at Unknown time    Musculoskeletal: Strength & Muscle Tone: within normal limits Gait & Station: normal Patient leans: N/A  Psychiatric Specialty Exam: Physical Exam  Nursing note and vitals reviewed. Constitutional:  I concur with PE done in ED.    Review of Systems  Psychiatric/Behavioral: Positive for  depression. The patient is nervous/anxious.   All other systems reviewed and are  negative.   Blood pressure 112/68, pulse 86, temperature 98.8 F (37.1 C), temperature source Oral, resp. rate 18, height 5\' 11"  (1.803 m), weight 70.1 kg (154 lb 8 oz), SpO2 100 %.Body mass index is 21.55 kg/m.  General Appearance: Disheveled  Eye Contact:  Fair  Speech:  Normal Rate  Volume:  Normal  Mood:  Dysphoric  Affect:  Appropriate  Thought Process:  Goal Directed and Descriptions of Associations: Circumstantial  Orientation:  Full (Time, Place, and Person)  Thought Content:  Delusions, Paranoid Ideation and Rumination  Suicidal Thoughts:  yes on admission , denies it today - is paranoid and potential danger to self or others  Homicidal Thoughts:  No  Memory:  Immediate;   Fair Recent;   Fair Remote;   Fair  Judgement:  Impaired  Insight:  Shallow  Psychomotor Activity:  Restlessness  Concentration:  Concentration: Fair and Attention Span: Fair  Recall:  [employer] of Knowledge:  Fair  Language:  Fair  Akathisia:  No  Handed:  Right  AIMS (if indicated):     Assets:  Desire for Improvement  ADL's:  Intact  Cognition:  WNL  Sleep:          Treatment Plan Summary:Jacorie Knightis a 29 y.o.[name] who is single , unemployed , who has a hx of schizophrenia as well as alcohol and cocaine abuse, who presented with suicidal ideation with a plan to kill self with a knife.  Patient continues to be paranoid, delusional, guarded , attempts to minimize his sx. Will need to be observed on the unit.  Daily contact with patient to assess and evaluate symptoms and progress in treatment and Medication management Patient will benefit from inpatient treatment and stabilization.  Estimated length of stay is 5-7 days.  Reviewed past medical records,treatment plan.  Will continue Haldol 2 mg po bid for psychosis. Will continue Cogentin 0.5 mg po bid for EPS. Will get collateral information from  Dr.Headen regarding Invega sustenna IM. Will continue Trilpetal 150 mg po bid for mood sx. Will continue CIWA/Ativan prn protocol for alcohol, bzd abuse/withdrawal sx. Will continue to monitor vitals ,medication compliance and treatment side effects while patient is here.  Will monitor for medical issues as well as call consult as needed.  Reviewed labs BAL<5 , UDS - pos for bzd, cocaine, cbc - wnl, ca - 8.8 , ,will order tsh, lipid panel, vitamin d, ekg for qtc, hba1c, pl. CSW will start working on disposition.  Patient to participate in therapeutic milieu .      Observation Level/Precautions:  15 minute checks    Psychotherapy:  Individual and group therapy     Consultations:  csw  Discharge Concerns:  Stability and safety       I certify that inpatient services furnished can reasonably be expected to improve the patient's condition.    Saafir Abdullah, MD 8/28/201712:21 PM

## 2015-11-30 NOTE — Progress Notes (Signed)
Recreation Therapy Notes  Date: 11/30/15 Time: 1000 Location: 500 Hall Dayroom  Group Topic: Anger Management  Goal Area(s) Addresses:  Patient will identify triggers for anger.  Patient will identify physical reaction to anger.   Patient will identify benefit of using coping skills when angry.  Behavioral Response:  Restless  Intervention: Anger umbrella worksheet, pencils  Activity: Anger Umbrella.  Patients were given worksheets with umbrellas on them to fill in.  Patients were to write the things that trigger their anger on the outside of the umbrella and the emotional reactions to those triggers on the inside of the umbrella.  Education: Anger Management, Discharge Planning   Education Outcome: Acknowledges education/In group clarification offered/Needs additional education.   Clinical Observations/Feedback: Pt came at the end of group and completed worksheet with LRT.  Pt identified some of his triggers for anger are "when people scream at me, when I fall and when people make assumptions".  Pt stated he shows his anger by hitting, hollering, and being jealous.  Pt went on to say he could effectively deal with his anger by walking away, take a minute to cool down and take responsibility for actions.   Caroll RancherMarjette Demorio Seeley, LRT/CTRS     Caroll RancherLindsay, Shandelle Borrelli A 11/30/2015 12:27 PM

## 2015-11-30 NOTE — Progress Notes (Signed)
Patient ID: Kerrin ChampagneZachary Banik, male   DOB: 1985-12-05, 30 y.o.   MRN: [id number]  Pt currently presents with a flat affect and anxious behavior. Pt reports to Clinical research associatewriter that their goal is "getting a phone call from a lady friend." Pt states "If I am asleep, please wake me up." Pt reports okay sleep without sleep medicine, voices request to take something to help him sleep more consistently. Pt encouraged to notify MD in the morning, will monitor his sleep quantity tonight.  Pt provided with medications per providers orders. Pt's labs and vitals were monitored throughout the night. Pt supported emotionally and encouraged to express concerns and questions. Pt educated on medications and alternative relaxation techniques.  Pt's safety ensured with 15 minute and environmental checks. Pt currently denies SI/HI and A/V hallucinations. Pt verbally agrees to seek staff if SI/HI or A/VH occurs and to consult with staff before acting on any harmful thoughts. Will continue POC.

## 2015-11-30 NOTE — Progress Notes (Signed)
DAR NOTE: Patient presents with anxious affect and depressed mood. Pt signed 72 hrs 11/30/15 at 1000. [facility] Complained of tooth ache, denies auditory and visual hallucinations.  Rates depression at 6, hopelessness at 4, and anxiety at 6.  Maintained on routine safety checks.  Medications given as prescribed.  Support and encouragement offered as needed. Patient observed socializing with peers in the dayroom.  Offered no complaint.

## 2015-12-01 LAB — LIPID PANEL
Cholesterol: 118 mg/dL (ref 0–200)
HDL: 57 mg/dL (ref >40)
LDL CALC: 50 mg/dL (ref 0–99)
TRIGLYCERIDES: 57 mg/dL (ref <150)
Total CHOL/HDL Ratio: 2.1 RATIO
VLDL: 11 mg/dL (ref 0–40)

## 2015-12-01 LAB — TSH: TSH: 1.443 u[IU]/mL (ref 0.350–4.500)

## 2015-12-01 MED ORDER — PALIPERIDONE PALMITATE 156 MG/ML IM SUSP: 156.0000 mg | INTRAMUSCULAR | 0 refills | Status: DC

## 2015-12-01 MED ORDER — HALOPERIDOL 2 MG PO TABS: 2.0000 mg | ORAL_TABLET | Freq: Two times a day (BID) | ORAL | 0 refills | Status: DC

## 2015-12-01 MED ORDER — NICOTINE 21 MG/24HR TD PT24: 21.0000 mg | MEDICATED_PATCH | Freq: Every day | TRANSDERMAL | 0 refills | Status: DC | PRN

## 2015-12-01 MED ORDER — BENZTROPINE MESYLATE 0.5 MG PO TABS: 0.5000 mg | ORAL_TABLET | Freq: Two times a day (BID) | ORAL | 0 refills | Status: DC

## 2015-12-01 MED ORDER — OXCARBAZEPINE 150 MG PO TABS: 150.0000 mg | ORAL_TABLET | Freq: Two times a day (BID) | ORAL | 0 refills | Status: DC

## 2015-12-01 MED ORDER — BENZOCAINE 10 % MT GEL: Freq: Four times a day (QID) | OROMUCOSAL | 0 refills | Status: DC | PRN

## 2015-12-01 MED ORDER — PALIPERIDONE PALMITATE 156 MG/ML IM SUSP: 156.0000 mg | INTRAMUSCULAR | Status: DC

## 2015-12-01 MED ORDER — CLINDAMYCIN HCL 300 MG PO CAPS
300.0000 mg | ORAL_CAPSULE | Freq: Three times a day (TID) | ORAL | 0 refills | Status: DC
Start: 2015-12-01 — End: 2017-09-26

## 2015-12-01 MED ORDER — CHLORDIAZEPOXIDE HCL 25 MG PO CAPS
25.0000 mg | ORAL_CAPSULE | Freq: Four times a day (QID) | ORAL | Status: DC | PRN
Start: 2015-12-01 — End: 2015-12-02
  Administered 2015-12-01: 25 mg via ORAL
  Filled 2015-12-01 (×2): qty 1

## 2015-12-01 NOTE — Progress Notes (Signed)
  [facility] :  Will you be returning to the same living situation after discharge:  No. At discharge, do you have transportation home?: Yes,  [facility] worker Do you have the ability to pay for your medications: Yes,  MCD  Release of information consent forms completed and in the chart;  Patient's signature needed at discharge.  Patient to Follow up at: Follow-up Information    [facility].   Why:  Trinna Postlex will be here at 7:30AM the day of d/c [facility] Contact information: [address]  [place] [phone]       [facility] Follow up on 12/02/2015.   Why:  Arrive no later than 8AM for your screening for admission appointment Contact information: [address] [place] [address] [phone]           Next level of care provider has [facility]:no  Safety Planning and Suicide Prevention discussed: Yes,  yes  Have you used any form of tobacco in the last 30 days? (Cigarettes, Smokeless Tobacco, Cigars, and/or Pipes): Yes  Has patient been referred to the Quitline?: Patient refused referral  Patient has been referred for addiction treatment: Yes  Chris RogueRodney B [name] Read 12/01/2015, 5:21 PM

## 2015-12-01 NOTE — Progress Notes (Signed)
Recreation Therapy Notes  Date: 12/01/15 Time: 1000 Location: 500 Hall Dayroom  Group Topic: Communication  Goal Area(s) Addresses:  Patient will effectively communicate with peers in group.  Patient will verbalize benefit of healthy communication. Patient will verbalize positive effect of healthy communication on post d/c goals.  Patient will identify communication techniques that made activity effective for group.   Behavioral Response: Engaged  Intervention: ONEOKBeach Ball, chairs  Activity: Insurance claims handlereated Soccer.  Chairs were arranged around the room in [name] semi circle.  Patients had to work together to try and score the ball in the goal against the LRT.  Patients had to score 20 points.  Education: Communication, Discharge Planning  Education Outcome: Acknowledges understanding/In group clarification offered/Needs additional education.   Clinical Observations/Feedback: Pt was very engaged and active during group.  He communicated well with his peers about how to complete the activity.  Pt also stated the group had to communicate and work together to do the activity.   Chris RancherMarjette [name] [name], Chris [name]    Chris RancherLindsay, Chris [name] 12/01/2015 12:14 PM

## 2015-12-01 NOTE — Progress Notes (Signed)
[facility] MD Progress Note  12/01/2015 2:06 PM Zacharey Jensen  MRN:  [id number] Subjective: Patient states " I am ok.'  Objective: [place] Hill[place] a 30 y.o.[name] who is single , unemployed , who has a hx of schizophrenia as well as alcohol and cocaine abuse,who presentedwith suicidal ideation with a plan to kill self with a knife.   Patient seen and chart reviewed.Discussed patient with treatment team.  Patient today appears to be less paranoid , is visible in milieu. Pt is motivated to go to a treatment program - CSW to work with pt on this. Per staff - pt continues to need support.     Principal Problem: Paranoid schizophrenia (HCC) Diagnosis:   Patient Active Problem List   Diagnosis Date Noted  . Paranoid schizophrenia (HCC) [F20.0] 11/30/2015  . Cocaine use disorder, moderate, dependence (HCC) [F14.20] 11/30/2015  . Alcohol use disorder, moderate, dependence (HCC) [F10.20] 11/30/2015  . Tooth ache [K08.89] 11/30/2015  . Sedative, hypnotic or anxiolytic use disorder, mild, abuse [F13.10] 11/30/2015   Total Time spent with patient: 20 minutes  Past Psychiatric History: Please see H&P.   Past Medical History:  Past Medical History:  Diagnosis Date  . Anxiety   . Bipolar 2 disorder (HCC)   . Depression   . Schizophrenia (HCC)    History reviewed. No pertinent surgical history. Family History:  Family History  Problem Relation Age of Onset  . Bipolar disorder Other   . Schizophrenia Other    Family Psychiatric  History: Please see H&P.  Social History: Please see H&P.  History  Alcohol Use  . 1.0 oz/week  . 2 Standard drinks or equivalent per week     History  Drug Use  . Types: Marijuana    Social History   Social History  . Marital status: Single    Spouse name: N/A  . Number of children: N/A  . Years of education: N/A   Social History Main Topics  . Smoking status: Current Every Day Smoker    Packs/day: 0.50  . Smokeless tobacco: Current User  .  Alcohol use 1.0 oz/week    2 Standard drinks or equivalent per week  . Drug use:     Types: Marijuana  . Sexual activity: Yes   Other Topics Concern  . None   Social History Narrative  . None   Additional Social History:    Pain Medications: none Prescriptions: none Over the Counter: none History of alcohol / drug use?: Yes Negative Consequences of Use: Personal relationships Withdrawal Symptoms: Agitation, Irritability                    Sleep: Fair  Appetite:  Fair  Current Medications: Current Facility-Administered Medications  Medication Dose Route Frequency Provider Last Rate Last Dose  . acetaminophen (TYLENOL) tablet 650 mg  650 mg Oral Q6H PRN Charm Rings, NP   650 mg at 12/01/15 1101  . alum & mag hydroxide-simeth (MAALOX/MYLANTA) 200-200-20 MG/5ML suspension 30 mL  30 mL Oral PRN Charm Rings, NP      . benzocaine (ORAJEL) 10 % mucosal gel   Mouth/Throat QID PRN Jomarie Longs, MD      . benztropine (COGENTIN) tablet 0.5 mg  0.5 mg Oral BID Thermon Leyland, NP   0.5 mg at 12/01/15 0835  . chlordiazePOXIDE (LIBRIUM) capsule 25 mg  25 mg Oral Q6H PRN Sondi Desch, MD      . clindamycin (CLEOCIN) capsule 300 mg  300 mg Oral  Q8H Jomarie LongsSaramma Renuka Farfan, MD   300 mg at 12/01/15 0641  . folic acid (FOLVITE) tablet 1 mg  1 mg Oral Daily Charm RingsJamison Y Lord, NP   1 mg at 12/01/15 0836  . haloperidol (HALDOL) tablet 2 mg  2 mg Oral BID Charm RingsJamison Y Lord, NP   2 mg at 12/01/15 0836  . magnesium hydroxide (MILK OF MAGNESIA) suspension 30 mL  30 mL Oral Daily PRN Charm RingsJamison Y Lord, NP      . multivitamin with minerals tablet 1 tablet  1 tablet Oral Daily Charm RingsJamison Y Lord, NP   1 tablet at 12/01/15 0836  . nicotine (NICODERM CQ - dosed in mg/24 hours) patch 21 mg  21 mg Transdermal Daily PRN Charm RingsJamison Y Lord, NP      . nicotine (NICODERM CQ - dosed in mg/24 hours) patch 21 mg  21 mg Transdermal Daily Thermon LeylandLaura A Davis, NP   21 mg at 12/01/15 0800  . OXcarbazepine (TRILEPTAL) tablet 150 mg  150  mg Oral BID Charm RingsJamison Y Lord, NP   150 mg at 12/01/15 0836  . [START ON 12/11/2015] paliperidone (INVEGA SUSTENNA) injection 156 mg  156 mg Intramuscular Q28 days Jomarie LongsSaramma Jetta Murray, MD      . thiamine (VITAMIN B-1) tablet 100 mg  100 mg Oral Daily Charm RingsJamison Y Lord, NP   100 mg at 12/01/15 [id number]   Or  . thiamine (B-1) injection 100 mg  100 mg Intravenous Daily Charm RingsJamison Y Lord, NP        Lab Results:  Results for orders placed or performed during the hospital encounter of 11/28/15 (from the past 48 hour(s))  TSH     Status: None   Collection Time: 12/01/15  6:20 AM  Result Value Ref Range   TSH 1.443 0.350 - 4.500 uIU/mL    Comment: [facility]  Lipid panel     Status: None   Collection Time: 12/01/15  6:20 AM  Result Value Ref Range   Cholesterol 118 0 - 200 mg/dL   Triglycerides 57 <629<150 mg/dL   HDL 57 >52>40 mg/dL   Total CHOL/HDL Ratio 2.1 RATIO   VLDL 11 0 - 40 mg/dL   LDL Cholesterol 50 0 - 99 mg/dL    Comment:        Total Cholesterol/HDL:CHD Risk Coronary Heart Disease Risk Table                     Men   Women  1/2 Average Risk   3.4   3.3  Average Risk       5.0   4.4  2 X Average Risk   9.6   7.1  3 X Average Risk  23.4   11.0        Use the calculated Patient Ratio above and the CHD Risk Table to determine the patient's CHD Risk.        ATP III CLASSIFICATION (LDL):  <100     mg/dL   Optimal  [id number]  mg/dL   Near or Above                    Optimal  130-159  mg/dL   Borderline  [id number]  mg/dL   High  >253>190     mg/dL   Very High [facility]     Blood Alcohol level:  Lab Results  Component Value Date   [facility] 11/27/2015   ETH <5 11/10/2015  Metabolic Disorder Labs: No results found for: HGBA1C, MPG No results found for: PROLACTIN Lab Results  Component Value Date   CHOL 118 12/01/2015   TRIG 57 12/01/2015   HDL 57 12/01/2015   CHOLHDL 2.1 12/01/2015   VLDL 11 12/01/2015   LDLCALC 50 12/01/2015     Physical Findings: AIMS:  , ,  ,  ,    CIWA:  CIWA-Ar Total: 6 COWS:     Musculoskeletal: Strength & Muscle Tone: within normal limits Gait & Station: normal Patient leans: N/A  Psychiatric Specialty Exam: Physical Exam  Nursing note and vitals reviewed.   Review of Systems  Psychiatric/Behavioral: Positive for depression. The patient is nervous/anxious.   All other systems reviewed and are negative.   Blood pressure 123/73, pulse 85, temperature 97.6 F (36.4 C), temperature source Oral, resp. rate 17, height 5\' 11"  (1.803 m), weight 70.1 kg (154 lb 8 oz), SpO2 100 %.Body mass index is 21.55 kg/m.  General Appearance: Fairly Groomed  Eye Contact:  Fair  Speech:  Normal Rate  Volume:  Normal  Mood:  Anxious  Affect:  Congruent  Thought Process:  Goal Directed and Descriptions of Associations: Circumstantial  Orientation:  Full (Time, Place, and Person)  Thought Content:  Paranoid Ideation and Rumination  Suicidal Thoughts:  No is paranoid - likely danger to self or others.  Homicidal Thoughts:  No  Memory:  Immediate;   Fair Recent;   Fair Remote;   Fair  Judgement:  Impaired  Insight:  Shallow  Psychomotor Activity:  Restlessness  Concentration:  Concentration: Fair and Attention Span: Fair  Recall:  [employer] of Knowledge:  Fair  Language:  Fair  Akathisia:  No  Handed:  Right  AIMS (if indicated):     Assets:  Desire for Improvement  ADL's:  Intact  Cognition:  WNL  Sleep:        Treatment Plan Summary:Kevontay Knightis a 29 y.o.[name] who is single , unemployed , who has a hx of schizophrenia as well as alcohol and cocaine abuse,who presentedwith suicidal ideation with a plan to kill self with a knife.  Patient continues to be paranoid - although progressing. Pt is due for his Invega sustenna IM 156 mg on 12/11/15.  Daily contact with patient to assess and evaluate symptoms and progress in treatment and Medication management   Will continue Haldol  2 mg po bid for psychosis. Will continue Cogentin 0.5 mg po bid for EPS. Invega sustenna 156 mg IM q28 days - next dose on 12/11/15- per CSW who got  Collateral information. Will continue Trilpetal 150 mg po bid for mood sx. Will continue CIWA/Ativan prn protocol for alcohol, bzd abuse/withdrawal sx. Will continue to monitor vitals ,medication compliance and treatment side effects while patient is here.  Will monitor for medical issues as well as call consult as needed.  Reviewed labs - tsh - wnl, lipid panel - wnl . CSW will continue working on disposition.[facility].  Patient to participate in therapeutic milieu .   Monserath Neff, MD 12/01/2015, 2:06 PM

## 2015-12-01 NOTE — [facility] Group Notes (Signed)

## 2015-12-01 NOTE — Progress Notes (Signed)
Adult Psychoeducational Group Note  Date:  12/01/2015 Time:  9:47 PM  Group Topic/Focus:  Wrap-Up Group:   The focus of this group is to help patients review their daily goal of treatment and discuss progress on daily workbooks.   Participation Level:  Active  Participation Quality:  Appropriate  Affect:  Appropriate  Cognitive:  Appropriate  Insight: Appropriate  Engagement in Group:  Engaged  Modes of Intervention:  Activity  Additional Comments:  Patient rated his day a 10. Goal is to practice resilience. Natasha MeadKiara M Trinh Sanjose 12/01/2015, 9:47 PM

## 2015-12-01 NOTE — [facility] Suicide Risk Assessment (Signed)
[facility] INPATIENT:  Family/Significant Other Suicide Prevention Education  Suicide Prevention Education:  Patient Refusal for Family/Significant Other Suicide Prevention Education: The patient Chris [name] has refused to provide written consent for family/significant other to be provided Family/Significant Other Suicide Prevention Education during admission and/or prior to discharge.  Physician notified.  Chris [name] 12/01/2015, 5:21 PM

## 2015-12-01 NOTE — Progress Notes (Signed)
DAR NOTE: Patient mood and affect is calm on approach.   Denies  auditory and visual hallucinations.  Rates depression at 0, hopelessness at 0, and anxiety at 0.  Described energy level as normal and concentration as good.  Maintained on routine safety checks.  Medications given as prescribed.  Support and encouragement offered as needed.  Attended group and participated.  States goal for today is "to get discharged."  Patient observed socializing with peers in the dayroom.  Tylenol given for complain of toothache with good effect.

## 2015-12-01 NOTE — Tx Team (Signed)
Interdisciplinary Treatment Plan Update (Adult)  Date:  12/01/2015   Time Reviewed:  10:37 AM   Progress in Treatment: Attending groups: Yes. Participating in groups:  Yes. Taking medication as prescribed:  Yes. Tolerating medication:  Yes. Family/Significant other contact made:  Yes Patient understands diagnosis:  Yes  As evidenced by seeking help with SI Discussing patient identified problems/goals with staff:  Yes, see initial care plan. Medical problems stabilized or resolved:  Yes. Denies suicidal/homicidal ideation: Yes. Issues/concerns per patient self-inventory:  No. Other:  New problem(s) identified:  Discharge Plan or Barriers: Pt signed 72 hr request on 8/28-d/c Thursday at latest  Reason for Continuation of Hospitalization: Depression  Delusions Paranoia Medication Stabilization  Comments: Pt reports he has been off his medications and is having SI. Also reports he has been abusing etoh and drugs. Pt brought in by [employer], voluntary. Calm and cooperative in triage. Pt also endorses auditory hallucinations.  12/01/15: Patient continues to be paranoid, delusional, guarded , attempts to minimize his sx. Will need to be observed on the unit.  Will continue Haldol 2 mg po bid for psychosis. Will continue Cogentin 0.5 mg po bid for EPS. Will get collateral information from Dr.Headen regarding Invega sustenna IM. Will continue Trilpetal 150 mg po bid for mood sx. Will continue CIWA/Ativan prn protocol for alcohol, bzd abuse/withdrawal sx.  Estimated length of stay: 2-3 days  New goal(s):  Review of initial/current patient goals per problem list:   Review of initial/current patient goals per problem list:  1. Goal(s): Patient will participate in aftercare plan   Met: No   Target date: 3-5 days post admission date   As evidenced by: Patient will participate within aftercare plan AEB aftercare provider and housing plan at discharge being identified. 12/01/15:   Despite stating he wants to d/c, pt is relying on his [employer] to secure a bed for him at [facility].  She is working on rescheduling as he had an appointment yesterday which he was unable to attend due to hospitalization.   2. Goal (s): Patient will exhibit decreased depressive symptoms and suicidal ideations.   Met: No   Target date: 3-5 days post admission date   As evidenced by: Patient will utilize self rating of depression at 3 or below and demonstrate decreased signs of depression or be deemed stable for discharge by MD. 12/01/15:  Rates depression a 6 today          4. Goal(s): Patient will demonstrate decreased signs of withdrawal due to substance abuse   Met: No   Target date: 3-5 days post admission date   As evidenced by: Patient will produce a CIWA/COWS score of 0, have stable vitals signs, and no symptoms of withdrawal 12/01/15:  CIWA score of 6 today    5. Goal(s): Patient will demonstrate decreased signs of psychosis  * Met: No  * Target date: 3-5 days post admission date  * As evidenced by: Patient will demonstrate decreased frequency of AVH or return to baseline function 12/01/15:  Pt endorses paranoia, delusions           Attendees: Patient:  12/01/2015 10:37 AM   Family:   12/01/2015 10:37 AM   Physician:  Ursula Alert, MD 12/01/2015 10:37 AM   Nursing:   Gaylan Gerold, RN 12/01/2015 10:37 AM   CSW:    Roque Lias, LCSW   12/01/2015 10:37 AM   Other:  12/01/2015 10:37 AM   Other:   12/01/2015 10:37 AM  Other:  Lars Pinks, Nurse CM 12/01/2015 10:37 AM   Other:   12/01/2015 10:37 AM   Other:  Norberto Sorenson, [place]  12/01/2015 10:37 AM   Other:  12/01/2015 10:37 AM   Other:  12/01/2015 10:37 AM   Other:  12/01/2015 10:37 AM   Other:  12/01/2015 10:37 AM   Other:  12/01/2015 10:37 AM   Other:   12/01/2015 10:37 AM    Scribe for Treatment Team:   Trish Mage, 12/01/2015 10:37 AM

## 2015-12-01 NOTE — [facility] Counselor (Signed)
PSA not done.  Pt left within 72 hours of admission.

## 2015-12-01 NOTE — [facility] Suicide Risk Assessment (Signed)
[facility]   Principal Problem: Paranoid schizophrenia [facility](HCC) Discharge Diagnoses:  Patient Active Problem List   Diagnosis Date Noted  . Paranoid schizophrenia (HCC) [F20.0] 11/30/2015  . Cocaine use disorder, moderate, dependence (HCC) [F14.20] 11/30/2015  . Alcohol use disorder, moderate, dependence (HCC) [F10.20] 11/30/2015  . Tooth ache [K08.89] 11/30/2015  . Sedative, hypnotic or anxiolytic use disorder, mild, abuse [F13.10] 11/30/2015    Total Time spent with patient: 30 minutes  Musculoskeletal: Strength & Muscle Tone: within normal limits Gait & Station: normal Patient leans: N/A  Psychiatric Specialty Exam: Review of Systems  Psychiatric/Behavioral: Positive for substance abuse. Negative for depression, hallucinations and suicidal ideas.  All other systems reviewed and are negative.   Blood pressure 123/73, pulse 85, temperature 97.6 F (36.4 C), temperature source Oral, resp. rate 17, height 5\' 11"  (1.803 m), weight 70.1 kg (154 lb 8 oz), SpO2 100 %.Body mass index is 21.55 kg/m.  General Appearance: Casual  Eye Contact::  Fair  Speech:  Clear and Coherent409  Volume:  Normal  Mood:  Euthymic  Affect:  Appropriate  Thought Process:  Goal Directed and Descriptions of Associations: Intact  Orientation:  Full (Time, Place, and Person)  Thought Content:  Logical  Suicidal Thoughts:  No  Homicidal Thoughts:  No  Memory:  Immediate;   Fair Recent;   Fair Remote;   Fair  Judgement:  Fair  Insight:  Shallow  Psychomotor Activity:  Normal  Concentration:  Fair  Recall:  [employer]  Fund of Knowledge:Fair  Language: Fair  Akathisia:  No  Handed:  Right  AIMS (if indicated):     Assets:  Desire for Improvement  Sleep:     Cognition: WNL  ADL's:  Intact   Mental Status Per Nursing Assessment::   On Admission:  Suicidal ideation indicated by patient  Demographic Factors:  Male  Loss Factors: NA  Historical  Factors: Impulsivity  Risk Reduction Factors:   Positive social support, Positive therapeutic relationship and Positive coping skills or problem solving skills  Continued Clinical Symptoms:  Alcohol/Substance Abuse/Dependencies Previous Psychiatric Diagnoses and Treatments  Cognitive Features That Contribute To Risk:  None    Suicide Risk:  Minimal: No identifiable suicidal ideation.  Patients presenting with no risk factors but with morbid ruminations; may be classified as minimal risk based on the severity of the depressive symptoms  Follow-up Information    [facility] .           Plan Of Care/Follow-up recommendations:  Activity:  NO RESTRICTIONS Diet:  REGULAR Tests:  NONE Other:  INVEGA SUSTENNA 156 MG IM - NEXT DOSE ON 12/11/15- PATIENT IS ON TWO ANTIPSYCHOTICS- HALDOL PO AND INVEGA SUSTENNA IM.  Molina Hollenback, MD 12/01/2015, 3:04 PM

## 2015-12-01 NOTE — Discharge Summary (Signed)
Physician Discharge Summary Note  Patient:  Chris [name] is an 30 y.o., male  MRN:  [id number]  DOB:  July 29, 1985  Patient phone:  [phone] (home)   Patient address:   [address] [place] [address],   Total Time spent with patient: Greater than 30 minutes  Date of Admission:  11/28/2015  Date of Discharge: 01-02-16  Reason for Admission: Psychosis  Principal Problem: Paranoid schizophrenia Chris [name])  Discharge Diagnoses: Patient Active Problem List   Diagnosis Date Noted  . Paranoid schizophrenia (HCC) [F20.0] 11/30/2015  . Cocaine use disorder, moderate, dependence (HCC) [F14.20] 11/30/2015  . Alcohol use disorder, moderate, dependence (HCC) [F10.20] 11/30/2015  . Tooth ache [K08.89] 11/30/2015  . Sedative, hypnotic or anxiolytic use disorder, mild, abuse [F13.10] 11/30/2015   Past Psychiatric History: Paranoid Schizophrenia  Past Medical History:  Past Medical History:  Diagnosis Date  . Anxiety   . Bipolar 2 disorder (HCC)   . Depression   . Schizophrenia (HCC)    History reviewed. No pertinent surgical history. Family History:  Family History  Problem Relation Age of Onset  . Bipolar disorder Other   . Schizophrenia Other    Family Psychiatric  History: See H&P  Social History:  History  Alcohol Use  . 1.0 oz/week  . 2 Standard drinks or equivalent per week     History  Drug Use  . Types: Marijuana    Social History   Social History  . Marital status: Single    Spouse name: N/A  . Number of children: N/A  . Years of education: N/A   Social History Main Topics  . Smoking status: Current Every Day Smoker    Packs/day: 0.50  . Smokeless tobacco: Current User  . Alcohol use 1.0 oz/week    2 Standard drinks or equivalent per week  . Drug use:     Types: Marijuana  . Sexual activity: Yes   Other Topics Concern  . None   Social History Narrative  . None   Hospital Course: Deny Chris [name] a 30 y.o. [name] who is single ,  unemployed, who has a hx of schizophrenia as well as alcohol & cocaine abuse,who presentedwith suicidal ideation with a plan to kill self with a knife. Per initial notes in EHR : ' Patient  admits to paranoid thoughts. He feels people at the [name] want to kill him. States experiencing this for the last two days but denies any homicidal thoughts.Patient continues to use alcohol, once or twice a week and cocaine powder once a week. Patient's last admission was in 2014. Patient reports current outpatient treatment at [facility] with Dr. Omelia Blackwater.   Chris [name] was admitted to the [facility] adult unit with his UDS test results positive for Benzodiazepine & Cocaine. He also has hx of alcohol abuse issues. His BAL on admission was <5 per toxicology test reports. Although, presented feeling depressed, paranoid & suicidal with plans to kill himself with a knife, Chris [name] was not presenting with any substance withdrawal symptoms. However, he did receive Ativan 1 mg prn for detoxification treatments in the event that he developed substance withdrawal symptoms. He was in need of & received mood stabilization treatments.   During the course of his hospitalization, Chris [name] was medicated & discharged on; Haldol 2 mg for mood control, Cogentin 0.5 mg for EPS, Trileptal 150 mg for mood stabilization, Nicotine patch 21 mg for smoking cessation & Paliperidone 156 mg IM Q 28 days for mood control. This medication is  due to administered on 12-11-15. He also received other medication regimen for the other medical issues that he presented. He was enrolled & participated in the Group counseling sessions being offered and held on this unit. He learned coping skills that should help him cope better & maintain mood stability after discharge. He tolerated his treatment regimen without any adverse effects or reactions reported.  Chris [name]'s symptoms responded well to his treatment regimen. This is evidenced by his reports of improved  mood, absence of suicidal ideations & presentation of good affect. He is curently being discharged as his mood has stabilized. However, he is going home on 2 separate antipsychotic medications (Haldol 2 mg tablets & Paliperidone 156 mg injectable). This is because his symptoms has failed to respond well to an antipsychotic monotherapy.The failed antipsychotic monotherapies include; Seroquel & Paliperidone.  However, a combination therapy of Haldol 2 mg tablets & Paliperidone 156 mg injectable seem to have helped improve his symptoms. As a result, it is necessary for Chris [name] to continue on these combination therapies to achieve maximum symptom control after discharge. However, in the event that his symptoms happen to improve or decrease, he can then be titrated down to an antipsychotic monotherapy to avoid risks of metabolic syndrome and other related adverse effects.This has to be done within the proper evaluation & judgement of his outpatient provider.   Chris [name]'s symptoms has improved. He is currently mentally & medically stable to be discharged to continue on an outpatient clinic for routine psychiatric care & medication management as noted below. And for further substance abuse treatment, he will be receiving this service from the [name] in [place], [place]. He is provided with all the necessary information required to make these appointments without problems. Upon discharge, he adamantly denies any SIHI, AVH, delusional thoughts and or paranoia. He was provided with a 14 days worth, supply samples of his [name] discharge medications. He left [name] with all personal belongings in no apparent distress. Transportation per [employer].Marland Kitchen  Physical Findings: AIMS:  , ,  ,  ,    CIWA:  CIWA-Ar Total: 0 COWS:     Musculoskeletal: Strength & Muscle Tone: within normal limits Gait & Station: normal Patient leans: N/A  Psychiatric Specialty Exam: Physical Exam   Constitutional: He appears well-developed.  HENT:  Head: Normocephalic.  Eyes: Pupils are equal, round, and reactive to light.  Neck: Normal range of motion.  Cardiovascular: Normal rate.   Respiratory: Effort normal.  GI: Soft.  Genitourinary:  Genitourinary Comments: Denies any issues in this area.  Musculoskeletal: Normal range of motion.  Neurological: He is alert.  Skin: Skin is warm.    Review of Systems  Constitutional: Negative.   HENT: Negative.   Eyes: Negative.   Respiratory: Negative.   Cardiovascular: Negative.   Gastrointestinal: Negative.   Genitourinary: Negative.   Musculoskeletal: Negative.   Skin: Negative.   Neurological: Negative.   Endo/Heme/Allergies: Negative.   Psychiatric/Behavioral: Positive for depression (Stable) and hallucinations (Hx. Psychosis ). Negative for memory loss and suicidal ideas. The patient is nervous/anxious and has insomnia.     Blood pressure (!) 142/81, pulse 75, temperature 97.6 F (36.4 C), temperature source Oral, resp. rate 17, height 5\' 11"  (1.803 m), weight 70.1 kg (154 lb 8 oz), SpO2 100 %.Body mass index is 21.55 kg/m.  See Md's SRA   Have you used any form of tobacco in the last 30 days? (Cigarettes, Smokeless Tobacco, Cigars, and/or Pipes): Yes  Has  this patient used any form of tobacco in the last 30 days? (Cigarettes, Smokeless Tobacco, Cigars, and/or Pipes):Yes, provided with a Nicotine patch prescription upon discharge.  Blood Alcohol level:  Lab Results  Component Value Date   [facility] <5 11/27/2015   ETH <5 11/10/2015   Metabolic Disorder Labs:  Lab Results  Component Value Date   HGBA1C 4.9 12/01/2015   MPG 94 12/01/2015   Lab Results  Component Value Date   PROLACTIN 58.4 (H) 12/01/2015   Lab Results  Component Value Date   CHOL 118 12/01/2015   TRIG 57 12/01/2015   HDL 57 12/01/2015   CHOLHDL 2.1 12/01/2015   VLDL 11 12/01/2015   LDLCALC 50 12/01/2015   See Psychiatric Specialty Exam and  Suicide Risk Assessment completed by Attending Physician prior to discharge.  Discharge destination:  Home  Is patient on multiple antipsychotic therapies at discharge:  Yes,   Do you recommend tapering to monotherapy for antipsychotics?  Yes    Has Patient had three or more failed trials of antipsychotic monotherapy by history:  No, however, the trials on Seroquel & Paliperidone as an antipsychotic monotherapy has failed to control patients symptoms.  Recommended Plan for Multiple Antipsychotic Therapies: And because patient has not been able to achieve symptoms control under an antipsychotic monotherapy, he is currently receiving & being discharged on 2 separate antipsychotic medications Haldol tablets & Paliperidone injectable which seem effective at this time. It will benefit Earna CoderZachary to continue these combination antipsychotic therapies as recommended. However, as his symptoms continue to improve, he may be titrated down to an antipsychotic monotherapy to avoid antipsychotic medication related metabolic syndrome. This has to be done within the discretion & proper judgement of his outpatient provider.    Medication List    TAKE these medications     Indication  benzocaine 10 % mucosal gel Commonly known as:  ORAJEL Use as directed in the mouth or throat 4 (four) times daily as needed for mouth pain.  Indication:  Toothache   benztropine 0.5 MG tablet Commonly known as:  COGENTIN Take 1 tablet (0.5 mg total) by mouth 2 (two) times daily. For prevention of drug induced tremors. What changed:  medication strength  how much to take  additional instructions  Another medication with the same name was removed. Continue taking this medication, and follow the directions you see here.  Indication:  Extrapyramidal Reaction caused by Medications   clindamycin 300 MG capsule Commonly known as:  CLEOCIN Take 1 capsule (300 mg total) by mouth every 8 (eight) hours. For infection   Indication:  Infection   haloperidol 2 MG tablet Commonly known as:  HALDOL Take 1 tablet (2 mg total) by mouth 2 (two) times daily. For mood control  Indication:  Mood control   nicotine 21 mg/24hr patch Commonly known as:  NICODERM CQ - dosed in mg/24 hours Place 1 patch (21 mg total) onto the skin daily as needed (smoking cessation).  Indication:  Nicotine Addiction   OXcarbazepine 150 MG tablet Commonly known as:  TRILEPTAL Take 1 tablet (150 mg total) by mouth 2 (two) times daily. For mood stabilization What changed:  additional instructions  Indication:  Mood stabilization   paliperidone 156 MG/ML Susp injection Commonly known as:  INVEGA SUSTENNA Inject 1 mL (156 mg total) into the muscle every 28 (twenty-eight) days. (Due to be administered on 12-11-15): For mood control Start taking on:  12/11/2015  Indication:  Mood control      Follow-up Information  [facility] Follow up on 12/02/2015.   Why:  Trinna Post will be here at 7:30AM the day of d/c to take you to [name] information: [address]  [place] [phone]       [facility] Follow up on 12/02/2015.   Why:  Arrive no later than 8AM for your screening for admission appointment Contact information: [address] [place] [address] [phone]          Follow-up recommendations: Activity:  As tolerated Diet: As recommended by your primary care doctor. Keep all scheduled follow-up appointments as recommended.  Comments: Patient is instructed prior to discharge to: Take all medications as prescribed by his/her mental healthcare provider. Report any adverse effects and or reactions from the medicines to his/her outpatient provider promptly. Patient has been instructed & cautioned: To not engage in alcohol and or illegal drug use while on prescription medicines. In the event of worsening symptoms, patient is instructed to call the crisis hotline, 911 and or go to  the nearest ED for appropriate evaluation and treatment of symptoms. To follow-up with his/her primary care provider for your other medical issues, concerns and or health care needs.   Signed: Sanjuana Kava, NP, PMHNP, FNP-BC 12/02/2015, 1:34 PM

## 2015-12-02 DIAGNOSIS — F17210 Nicotine dependence, cigarettes, uncomplicated: Secondary | ICD-10-CM

## 2015-12-02 LAB — VITAMIN D 25 HYDROXY (VIT D DEFICIENCY, FRACTURES): VIT D 25 HYDROXY: 21.5 ng/mL — AB (ref 30.0–100.0)

## 2015-12-02 LAB — PROLACTIN: Prolactin: 58.4 ng/mL — ABNORMAL HIGH (ref 4.0–15.2)

## 2015-12-02 LAB — HEMOGLOBIN A1C
HEMOGLOBIN A1C: 4.9 % (ref 4.8–5.6)
MEAN PLASMA GLUCOSE: 94 mg/dL

## 2015-12-02 NOTE — Progress Notes (Signed)
Chris [name] has been discharged off the unit this am. He acknowledged all of his belongings and medications and then was escorted to the cafeteria for breakfast and then to the lobby to eat and [facility]. He was in stable condition upon discharge.
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
## 2016-09-02 ENCOUNTER — Emergency Department (HOSPITAL_COMMUNITY)
Admission: EM | Admit: 2016-09-02 | Discharge: 2016-09-03 | Disposition: A | Discharge status: Home / Self Care | Payer: Medicaid Other | Attending: Emergency Medicine | Admitting: Emergency Medicine

## 2016-09-02 ENCOUNTER — Encounter (HOSPITAL_COMMUNITY): Payer: Self-pay | Admitting: Emergency Medicine

## 2016-09-02 ENCOUNTER — Emergency Department (HOSPITAL_COMMUNITY): Payer: Medicaid Other

## 2016-09-02 DIAGNOSIS — S52125A Nondisplaced fracture of head of left radius, initial encounter for closed fracture: Secondary | ICD-10-CM | POA: Diagnosis not present

## 2016-09-02 DIAGNOSIS — Z79899 Other long term (current) drug therapy: Secondary | ICD-10-CM | POA: Insufficient documentation

## 2016-09-02 DIAGNOSIS — Y92410 Unspecified street and highway as the place of occurrence of the external cause: Secondary | ICD-10-CM | POA: Insufficient documentation

## 2016-09-02 DIAGNOSIS — Y999 Unspecified external cause status: Secondary | ICD-10-CM | POA: Diagnosis not present

## 2016-09-02 DIAGNOSIS — F17290 Nicotine dependence, other tobacco product, uncomplicated: Secondary | ICD-10-CM | POA: Insufficient documentation

## 2016-09-02 DIAGNOSIS — S59912A Unspecified injury of left forearm, initial encounter: Secondary | ICD-10-CM | POA: Diagnosis present

## 2016-09-02 DIAGNOSIS — Y9301 Activity, walking, marching and hiking: Secondary | ICD-10-CM | POA: Insufficient documentation

## 2016-09-02 DIAGNOSIS — W228XXA Striking against or struck by other objects, initial encounter: Secondary | ICD-10-CM | POA: Diagnosis not present

## 2016-09-02 NOTE — ED Notes (Signed)
Bed: [facility] Expected date:  Expected time:  Means of arrival:  Comments: No bed 
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
## 2016-09-03 ENCOUNTER — Emergency Department (HOSPITAL_COMMUNITY): Payer: Medicaid Other

## 2016-09-03 NOTE — ED Notes (Signed)
Pt's CASE NUMBER (by [employer])---- [phone]

## 2016-09-03 NOTE — Discharge Instructions (Signed)
You do have a fracture in your left arm near your elbow. Leave the splint in place. You need to follow-up with Dr. Magnus IvanBlackman--- call his office to make an appt. Return here for new concerns.

## 2016-09-03 NOTE — ED Notes (Signed)
Ortho Tech here to apply long arm splint on patient's left forearm.

## 2016-09-14 ENCOUNTER — Encounter (HOSPITAL_COMMUNITY): Payer: Self-pay | Admitting: Emergency Medicine

## 2016-09-14 ENCOUNTER — Emergency Department (HOSPITAL_COMMUNITY)
Admission: EM | Admit: 2016-09-14 | Discharge: 2016-09-15 | Disposition: A | Discharge status: Home / Self Care | Payer: Medicaid Other | Attending: Emergency Medicine | Admitting: Emergency Medicine

## 2016-09-14 ENCOUNTER — Emergency Department (HOSPITAL_COMMUNITY): Payer: Medicaid Other

## 2016-09-14 DIAGNOSIS — S92021A Displaced fracture of anterior process of right calcaneus, initial encounter for closed fracture: Secondary | ICD-10-CM | POA: Insufficient documentation

## 2016-09-14 DIAGNOSIS — Y9289 Other specified places as the place of occurrence of the external cause: Secondary | ICD-10-CM | POA: Insufficient documentation

## 2016-09-14 DIAGNOSIS — Z79899 Other long term (current) drug therapy: Secondary | ICD-10-CM | POA: Insufficient documentation

## 2016-09-14 DIAGNOSIS — S92001A Unspecified fracture of right calcaneus, initial encounter for closed fracture: Secondary | ICD-10-CM

## 2016-09-14 DIAGNOSIS — Y999 Unspecified external cause status: Secondary | ICD-10-CM | POA: Diagnosis not present

## 2016-09-14 DIAGNOSIS — W1789XA Other fall from one level to another, initial encounter: Secondary | ICD-10-CM | POA: Diagnosis not present

## 2016-09-14 DIAGNOSIS — S99921A Unspecified injury of right foot, initial encounter: Secondary | ICD-10-CM | POA: Diagnosis present

## 2016-09-14 DIAGNOSIS — Y9339 Activity, other involving climbing, rappelling and jumping off: Secondary | ICD-10-CM | POA: Diagnosis not present

## 2016-09-14 DIAGNOSIS — F17200 Nicotine dependence, unspecified, uncomplicated: Secondary | ICD-10-CM | POA: Diagnosis not present
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
## 2016-09-15 ENCOUNTER — Emergency Department (HOSPITAL_COMMUNITY): Payer: Medicaid Other

## 2016-09-15 MED ORDER — OXYCODONE-ACETAMINOPHEN 5-325 MG PO TABS
2.0000 | ORAL_TABLET | Freq: Once | ORAL | Status: AC
  Administered 2016-09-15: 2 via ORAL
  Filled 2016-09-15: qty 2

## 2016-09-15 MED ORDER — HYDROCODONE-ACETAMINOPHEN 5-325 MG PO TABS: 1.0000 | ORAL_TABLET | ORAL | 0 refills | Status: DC | PRN

## 2016-09-15 MED ORDER — NAPROXEN 500 MG PO TABS: 500.0000 mg | ORAL_TABLET | Freq: Two times a day (BID) | ORAL | 0 refills | Status: DC

## 2016-09-15 NOTE — ED Notes (Signed)
Pt is alert and oriented x 4 and is verbally responsive. Pt is c/o of 10/10 pain to right ankle. Pt

## 2016-09-15 NOTE — Discharge Instructions (Signed)
Do not put any weight on your right foot. Use crutches to help with this. Keep your splint on at all times. Keep your foot elevated as much as possible. Apply ice to your foot 3-4 times per day to limit swelling. Take Naproxen for pain. You may use Norco for severe pain as needed. Follow up with an orthopedist. Call in the morning to schedule a follow up appointment.

## 2016-09-15 NOTE — ED Provider Notes (Signed)
[facility]-EMERGENCY DEPT Provider Note   CSN: [id number] Arrival date & time: 09/14/16  2333    History   Chief Complaint Chief Complaint  Patient presents with  . Ankle Injury    HPI Chris [name] is a 31 y.o. male.  31 year old male with a history of bipolar 2 disorder, depression, and schizophrenia presents to the emergency department for evaluation of right foot pain. Patient states that he was jumping and misjudged the height of the ground beneath him. Once landing on the ground, patient was unable to bear weight on his right foot secondary to pain. He reports constant pain since injury. No medications taken prior to arrival. No associated numbness. History difficult to obtain secondary to patient cooperation. Patient continuing to ask for something to eat and drink. He was recently seen for fx to the left radial head.   The history is provided by the patient. No language interpreter was used.  Ankle Injury     Past Medical History:  Diagnosis Date  . Anxiety   . Bipolar 2 disorder (HCC)   . Depression   . Schizophrenia [facility](HCC)     Patient Active Problem List   Diagnosis Date Noted  . Paranoid schizophrenia (HCC) 11/30/2015  . Cocaine use disorder, moderate, dependence (HCC) 11/30/2015  . Alcohol use disorder, moderate, dependence (HCC) 11/30/2015  . Tooth ache 11/30/2015  . 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 No pertinent surgical history.     Home Medications    Prior to Admission medications   Medication Sig Start Date End Date Taking? Authorizing Provider  benzocaine (ORAJEL) 10 % mucosal gel Use as directed in the mouth or throat 4 (four) times daily as needed for mouth pain. 12/01/15   Armandina StammerNwoko, Agnes I, NP  benztropine (COGENTIN) 0.5 MG tablet Take 1 tablet (0.5 mg total) by mouth 2 (two) times daily. For prevention of drug induced tremors. 12/01/15   Armandina StammerNwoko, Agnes I, NP  clindamycin (CLEOCIN) 300 MG capsule  Take 1 capsule (300 mg total) by mouth every 8 (eight) hours. For infection 12/01/15   Armandina StammerNwoko, Agnes I, NP  haloperidol (HALDOL) 2 MG tablet Take 1 tablet (2 mg total) by mouth 2 (two) times daily. For mood control 12/01/15   Armandina StammerNwoko, Agnes I, NP  HYDROcodone-acetaminophen (NORCO/VICODIN) 5-325 MG tablet Take 1 tablet by mouth every 4 (four) hours as needed for severe pain. 09/15/16   Antony MaduraHumes, Samaia Iwata, PA-C  naproxen (NAPROSYN) 500 MG tablet Take 1 tablet (500 mg total) by mouth 2 (two) times daily. 09/15/16   Antony MaduraHumes, Josel Keo, PA-C  nicotine (NICODERM CQ - DOSED IN MG/24 HOURS) 21 mg/24hr patch Place 1 patch (21 mg total) onto the skin daily as needed (smoking cessation). 12/01/15   Armandina StammerNwoko, Agnes I, NP  OXcarbazepine (TRILEPTAL) 150 MG tablet Take 1 tablet (150 mg total) by mouth 2 (two) times daily. For mood stabilization 12/01/15   Armandina StammerNwoko, Agnes I, NP  paliperidone (INVEGA SUSTENNA) 156 MG/ML SUSP injection Inject 1 mL (156 mg total) into the muscle every 28 (twenty-eight) days. (Due to be administered on 12-11-15): For mood control 12/11/15   Sanjuana KavaNwoko, Agnes I, NP    Family History Family History  Problem Relation Age of Onset  . Bipolar disorder Other   . Schizophrenia Other     Social History Social History  Substance Use Topics  . Smoking status: Current Every Day Smoker    Packs/day: 0.50  . Smokeless tobacco: Current User  . Alcohol  use 1.0 oz/week    2 Standard drinks or equivalent per week     Allergies   Penicillins   Review of Systems Review of Systems Ten systems reviewed and are negative for acute change, except as noted in the HPI.    Physical Exam Updated Vital Signs BP 129/81 (BP Location: Left Arm)   Pulse 87   Resp 18   Ht 5\' 11"  (1.803 m)   Wt 69.9 kg (154 lb)   SpO2 99%   BMI 21.48 kg/m   Physical Exam  Constitutional: He is oriented to person, place, and time. He appears well-developed and well-nourished. No distress.  Nontoxic and in NAD  HENT:  Head: Normocephalic  and atraumatic.  Eyes: Conjunctivae and EOM are normal. No scleral icterus.  Neck: Normal range of motion.  Cardiovascular: Normal rate, regular rhythm and intact distal pulses.   Pulses:      Dorsalis pedis pulses are 2+ on the right side.       Posterior tibial pulses are 2+ on the right side.  Pulmonary/Chest: Effort normal. No respiratory distress.  Respirations even and unlabored  Musculoskeletal: Normal range of motion.       Right foot: There is tenderness, bony tenderness and swelling. There is normal capillary refill, no crepitus and no deformity.       Feet:  Compartments soft in the RLE and foot. There is swelling to the heel of the right foot with associated tenderness. Exam limited 2/2 patient cooperation.  Neurological: He is alert and oriented to person, place, and time. He exhibits normal muscle tone. Coordination normal.  Sensation to light touch intact in the right lower extremity. Patient able to wiggle toes, though exhibits poor effort.  Skin: Skin is warm and dry. No rash noted. He is not diaphoretic. No erythema. No pallor.  Psychiatric: He has a normal mood and affect. His behavior is normal.  Nursing note and vitals reviewed.    ED Treatments / Results  Labs (all labs ordered are listed, but only abnormal results are displayed) Labs Reviewed - No data to display  EKG  EKG Interpretation None       Radiology Dg Ankle Complete Right  Result Date: 09/15/2016 CLINICAL DATA:  Right ankle pain EXAM: RIGHT ANKLE - COMPLETE 3+ VIEW COMPARISON:  None. FINDINGS: Markedly comminuted calcaneal fracture involving the anterior, middle and posterior segments with subtalar extension of fracture lucency. No dislocation. Probable small fracture fragments adjacent to the cuboid bone. IMPRESSION: Comminuted and impacted calcaneal fractures with subtalar extension. Probable small fracture fragments arising from the cuboid bone. Electronically Signed   By: Jasmine Pang M.D.    On: 09/15/2016 00:06   Ct Foot Right Wo Contrast  Result Date: 09/15/2016 CLINICAL DATA:  Evaluate right calcaneal fracture identified on radiograph. Initial encounter. EXAM: CT OF THE RIGHT FOOT WITHOUT CONTRAST TECHNIQUE: Multidetector CT imaging of the right foot was performed according to the standard protocol. Multiplanar CT image reconstructions were also generated. COMPARISON:  Right ankle radiographs performed 09/14/2016 FINDINGS: Bones/Joint/Cartilage There is a comminuted joint depression type fracture of the calcaneus. This appears to reflect a Sanders type 3AC fracture, with an additional oblique fracture line extending across the base of the sustentaculum tali. There is underlying comminution about the primary fracture lines. There is anterior rotation of the calcaneal fragment containing the articular surface of the posterior facet. No additional fractures are seen. There is a bipartite medial sesamoid of the first toe. The talar dome is grossly  unremarkable. A few small loose bodies are seen adjacent to the distal fibula. No significant joint effusion is seen. Ligaments Suboptimally assessed by CT. Muscles and Tendons The visualized musculature is grossly unremarkable. Flexor and peroneal tendons track directly adjacent to the fracture sites. Extensor tendons are grossly unremarkable. Soft tissues Soft tissue injury is seen tracking about the dorsum of the hindfoot and midfoot. The sinus tarsi is grossly unremarkable in appearance. IMPRESSION: 1. Comminuted joint depression type fracture of the calcaneus. This appears to reflect a Sanders type 3AC fracture, with additional oblique fracture line extending across the base of the sustentaculum tali. Underlying comminution about the primary fracture lines, and anterior rotation of the calcaneal fragment containing the articular surface of the posterior facet. 2. Soft tissue injury about the dorsum of the hindfoot and midfoot. Flexor and peroneal  tendons track directly adjacent to the fracture sites, though they appear grossly intact. 3. Bipartite medial sesamoid of the first toe. 4. Small loose bodies noted adjacent to the distal fibula. Electronically Signed   By: Roanna Raider M.D.   On: 09/15/2016 03:28    Procedures Procedures (including critical care time)  Medications Ordered in ED Medications  oxyCODONE-acetaminophen (PERCOCET/ROXICET) 5-325 MG per tablet 2 tablet (2 tablets Oral Given 09/15/16 0122)     Initial Impression / Assessment and Plan / ED Course  I have reviewed the triage vital signs and the nursing notes.  Pertinent labs & imaging results that were available during my care of the patient were reviewed by me and considered in my medical decision making (see chart for details).     31 year old male presents to the emergency department for evaluation of right foot pain after a fall. He was noted to have a calcaneal fracture. Patient neurovascularly intact. The compartments remain soft. He has been placed in a bulky posterior dressing and given crutches. Patient instructed to be nonweightbearing.  Case was discussed with Dr. Roda Shutters of orthopedics given degree of fracture. Dr. Roda Shutters agrees that the patient will likely require surgery, but this is usually performed once swelling has subsided; approximately in 1 week. The patient has been instructed to call the office of Dr. Roda Shutters in the morning to schedule follow up. Supportive therapy indicated with return if symptoms worsen. Patient discharged in stable condition with no unaddressed concerns.   Final Clinical Impressions(s) / ED Diagnoses   Final diagnoses:  Closed displaced fracture of right calcaneus, unspecified portion of calcaneus, initial encounter    New Prescriptions New Prescriptions   HYDROCODONE-ACETAMINOPHEN (NORCO/VICODIN) 5-325 MG TABLET    Take 1 tablet by mouth every 4 (four) hours as needed for severe pain.   NAPROXEN (NAPROSYN) 500 MG TABLET    Take 1  tablet (500 mg total) by mouth 2 (two) times daily.     Antony Madura, PA-C 09/15/16 0426    Paula Libra, MD 09/15/16 [phone]

## 2016-09-15 NOTE — ED Notes (Signed)
Pt was given food and drinks. Pt states pain is 10/10 to right ankle.

## 2016-09-15 NOTE — Progress Notes (Signed)
Orthopedic Tech Progress Note Patient Details:  Chris ChampagneZachary [name] 1985-12-27 [id number]  Ortho Devices Type of Ortho Device: Post (short leg) splint Ortho Device/Splint Location: rle. bulky dressing added to splint. Ortho Device/Splint Interventions: Ordered, Application, Adjustment   Trinna PostMartinez, Jasyah Theurer J 09/15/2016, 3:50 AM

## 2016-09-17 ENCOUNTER — Encounter (HOSPITAL_COMMUNITY): Payer: Self-pay | Admitting: Emergency Medicine

## 2016-09-17 ENCOUNTER — Emergency Department (HOSPITAL_COMMUNITY)
Admission: EM | Admit: 2016-09-17 | Discharge: 2016-09-17 | Disposition: A | Discharge status: Home / Self Care | Payer: Medicaid Other | Attending: Emergency Medicine | Admitting: Emergency Medicine

## 2016-09-17 DIAGNOSIS — Z79899 Other long term (current) drug therapy: Secondary | ICD-10-CM | POA: Insufficient documentation

## 2016-09-17 DIAGNOSIS — Z760 Encounter for issue of repeat prescription: Secondary | ICD-10-CM | POA: Insufficient documentation

## 2016-09-17 DIAGNOSIS — F17200 Nicotine dependence, unspecified, uncomplicated: Secondary | ICD-10-CM | POA: Diagnosis not present

## 2016-09-17 DIAGNOSIS — W1840XD Slipping, tripping and stumbling without falling, unspecified, subsequent encounter: Secondary | ICD-10-CM | POA: Diagnosis not present

## 2016-09-17 DIAGNOSIS — S92011D Displaced fracture of body of right calcaneus, subsequent encounter for fracture with routine healing: Secondary | ICD-10-CM | POA: Diagnosis not present

## 2016-09-17 DIAGNOSIS — S92001D Unspecified fracture of right calcaneus, subsequent encounter for fracture with routine healing: Secondary | ICD-10-CM

## 2016-09-17 MED ORDER — HYDROCODONE-ACETAMINOPHEN 5-325 MG PO TABS: 1.0000 | ORAL_TABLET | ORAL | 0 refills | Status: DC | PRN

## 2016-09-17 MED ORDER — NAPROXEN 500 MG PO TABS: 500.0000 mg | ORAL_TABLET | Freq: Two times a day (BID) | ORAL | 0 refills | Status: DC
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
## 2016-09-17 NOTE — ED Notes (Signed)
Patient in bed  Given sprite sleeping.

## 2016-09-17 NOTE — ED Notes (Signed)
Patient given sandwich.  

## 2016-09-17 NOTE — ED Provider Notes (Signed)
[facility]-EMERGENCY DEPT Provider Note   CSN: [id number] Arrival date & time: 09/17/16  4098  By signing my name below, I, Orpah Cobb, attest that this documentation has been prepared under the direction and in the presence of Cheron Schaumann, [place]. Electronically Signed: Alan Ripper. Carrie Mew. , ED Scribe. 09/17/16. 9:55 AM.   History   Chief Complaint Chief Complaint  Patient presents with  . Medication Refill    HPI Chris [name] is a 31 y.o. male with hx of schizophrenia who [facility] complaining of medication refill with onset x1 day. Pt was seen x3 days ago and diagnosed with a R calcaneus fracture. He was prescribed pain medication at the time but states that he lost his prescription. Pt states that he slipped on the ground last night and landed on the R foot. Pt rates the R foot pain 7/10. He denies any modifying factors. Pt denies any other complaints. He reports know allergy to Penicillin. Of note, pt is homeless and states that he lives with his cousin. He denies calling the orthopedist for follow-up care.   The history is provided by the patient.    Past Medical History:  Diagnosis Date  . Anxiety   . Bipolar 2 disorder (HCC)   . Depression   . Schizophrenia [facility])     Patient Active Problem List   Diagnosis Date Noted  . Paranoid schizophrenia (HCC) 11/30/2015  . Cocaine use disorder, moderate, dependence (HCC) 11/30/2015  . Alcohol use disorder, moderate, dependence (HCC) 11/30/2015  . Tooth ache 11/30/2015  . 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 No pertinent surgical history.     Home Medications    Prior to Admission medications   Medication Sig Start Date End Date Taking? Authorizing Provider  benzocaine (ORAJEL) 10 % mucosal gel Use as directed in the mouth or throat 4 (four) times daily as needed for mouth pain. 12/01/15   Armandina Stammer I, NP  benztropine  (COGENTIN) 0.5 MG tablet Take 1 tablet (0.5 mg total) by mouth 2 (two) times daily. For prevention of drug induced tremors. 12/01/15   Armandina Stammer I, NP  clindamycin (CLEOCIN) 300 MG capsule Take 1 capsule (300 mg total) by mouth every 8 (eight) hours. For infection 12/01/15   Armandina Stammer I, NP  haloperidol (HALDOL) 2 MG tablet Take 1 tablet (2 mg total) by mouth 2 (two) times daily. For mood control 12/01/15   Armandina Stammer I, NP  HYDROcodone-acetaminophen (NORCO/VICODIN) 5-325 MG tablet Take 1 tablet by mouth every 4 (four) hours as needed for severe pain. 09/15/16   Antony Madura, PA-C  naproxen (NAPROSYN) 500 MG tablet Take 1 tablet (500 mg total) by mouth 2 (two) times daily. 09/15/16   Antony Madura, PA-C  nicotine (NICODERM CQ - DOSED IN MG/24 HOURS) 21 mg/24hr patch Place 1 patch (21 mg total) onto the skin daily as needed (smoking cessation). 12/01/15   Armandina Stammer I, NP  OXcarbazepine (TRILEPTAL) 150 MG tablet Take 1 tablet (150 mg total) by mouth 2 (two) times daily. For mood stabilization 12/01/15   Armandina Stammer I, NP  paliperidone (INVEGA SUSTENNA) 156 MG/ML SUSP injection Inject 1 mL (156 mg total) into the muscle every 28 (twenty-eight) days. (Due to be administered on 12-11-15): For mood control 12/11/15   Sanjuana Kava, NP    Family History Family History  Problem Relation Age of Onset  . Bipolar disorder Other   . Schizophrenia  Other     Social History Social History  Substance Use Topics  . Smoking status: Current Every Day Smoker    Packs/day: 0.50  . Smokeless tobacco: Current User  . Alcohol use 1.0 oz/week    2 Standard drinks or equivalent per week     Allergies   Penicillins   Review of Systems Review of Systems  Constitutional: Negative for fever.  Musculoskeletal: Positive for arthralgias (R foot).  All other systems reviewed and are negative.    Physical Exam Updated Vital Signs BP (!) 129/93 (BP Location: Left Arm)   Pulse 78   Temp 97.2 F (36.2 C)  (Oral)   Resp 20   SpO2 100%   Physical Exam  Constitutional: He appears well-developed and well-nourished.  HENT:  Head: Normocephalic and atraumatic.  Eyes: Conjunctivae are normal.  Neck: Neck supple.  Cardiovascular: Normal rate and regular rhythm.   No murmur heard. Pulmonary/Chest: Effort normal and breath sounds normal. No respiratory distress.  Abdominal: Soft. There is no tenderness.  Musculoskeletal: He exhibits no edema.  Splint in place, good color to toes.   Neurological: He is alert.  Skin: Skin is warm and dry.  Psychiatric: He has a normal mood and affect.  Nursing note and vitals reviewed.    ED Treatments / Results   DIAGNOSTIC STUDIES: Oxygen Saturation is 10% on RA, normal by my interpretation.   COORDINATION OF CARE: 9:55 AM-Discussed next steps with pt. Pt verbalized understanding and is agreeable with the plan.    Labs (all labs ordered are listed, but only abnormal results are displayed) Labs Reviewed - No data to display  EKG  EKG Interpretation None       Radiology No results found.  Procedures Procedures (including critical care time)  Medications Ordered in ED Medications - No data to display   Initial Impression / Assessment and Plan / ED Course  I have reviewed the triage vital signs and the nursing notes.  Pertinent labs & imaging results that were available during my care of the patient were reviewed by me and considered in my medical decision making (see chart for details).       Final Clinical Impressions(s) / ED Diagnoses   Final diagnoses:  Closed displaced fracture of right calcaneus with routine healing, unspecified portion of calcaneus, subsequent encounter    New Prescriptions Discharge Medication List as of 09/17/2016 10:21 AM     An After Visit Summary was printed and given to the patient.  I personally performed the services in this documentation, which was scribed in my presence.  The recorded  information has been reviewed and considered.   Barnet PallKaren SofiaPAC.  I personally performed the services in this documentation, which was scribed in my presence.  The recorded information has been reviewed and considered.   Barnet PallKaren SofiaPAC.    Elson AreasSofia, Leslie K, [place]PA-C 09/17/16 1123    Margarita Grizzleay, Danielle, MD 09/18/16 [phone]

## 2016-09-17 NOTE — ED Triage Notes (Signed)
Pt is polite and in NAD
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
## 2016-12-10 ENCOUNTER — Encounter (HOSPITAL_COMMUNITY): Payer: Self-pay | Admitting: Emergency Medicine

## 2017-09-26 ENCOUNTER — Emergency Department (HOSPITAL_COMMUNITY)
Admission: EM | Admit: 2017-09-26 | Discharge: 2017-09-26 | Disposition: A | Discharge status: Home / Self Care | Payer: Self-pay | Attending: Emergency Medicine | Admitting: Emergency Medicine

## 2017-09-26 ENCOUNTER — Other Ambulatory Visit: Payer: Self-pay

## 2017-09-26 ENCOUNTER — Encounter (HOSPITAL_COMMUNITY): Payer: Self-pay | Admitting: Emergency Medicine

## 2017-09-26 DIAGNOSIS — F14159 Cocaine abuse with cocaine-induced psychotic disorder, unspecified: Secondary | ICD-10-CM | POA: Diagnosis present

## 2017-09-26 DIAGNOSIS — F14151 Cocaine abuse with cocaine-induced psychotic disorder with hallucinations: Secondary | ICD-10-CM | POA: Insufficient documentation

## 2017-09-26 DIAGNOSIS — R440 Auditory hallucinations: Secondary | ICD-10-CM

## 2017-09-26 DIAGNOSIS — F17200 Nicotine dependence, unspecified, uncomplicated: Secondary | ICD-10-CM | POA: Insufficient documentation

## 2017-09-26 DIAGNOSIS — F1910 Other psychoactive substance abuse, uncomplicated: Secondary | ICD-10-CM | POA: Insufficient documentation

## 2017-09-26 LAB — CBC WITH DIFFERENTIAL/PLATELET
Basophils Absolute: 0 10*3/uL (ref 0.0–0.1)
Basophils Relative: 0 %
Eosinophils Absolute: 0.1 10*3/uL (ref 0.0–0.7)
Eosinophils Relative: 1 %
HCT: 46.2 % (ref 39.0–52.0)
Hemoglobin: 15.7 g/dL (ref 13.0–17.0)
Lymphocytes Relative: 26 %
Lymphs Abs: 1.7 10*3/uL (ref 0.7–4.0)
MCH: 30.7 pg (ref 26.0–34.0)
MCHC: 34 g/dL (ref 30.0–36.0)
MCV: 90.2 fL (ref 78.0–100.0)
Monocytes Absolute: 0.4 10*3/uL (ref 0.1–1.0)
Monocytes Relative: 6 %
Neutro Abs: 4.5 10*3/uL (ref 1.7–7.7)
Neutrophils Relative %: 67 %
Platelets: 186 10*3/uL (ref 150–400)
RBC: 5.12 MIL/uL (ref 4.22–5.81)
RDW: 14.8 % (ref 11.5–15.5)
WBC: 6.7 10*3/uL (ref 4.0–10.5)

## 2017-09-26 LAB — BASIC METABOLIC PANEL
Anion gap: 9 (ref 5–15)
BUN: 6 mg/dL (ref 6–20)
CO2: 26 mmol/L (ref 22–32)
Calcium: 9.1 mg/dL (ref 8.9–10.3)
Chloride: 106 mmol/L (ref 98–111)
Creatinine, Ser: 0.93 mg/dL (ref 0.61–1.24)
GFR calc Af Amer: >60 mL/min (ref >60)
GFR calc non Af Amer: >60 mL/min (ref >60)
Glucose, Bld: 81 mg/dL (ref 70–99)
Potassium: 3.6 mmol/L (ref 3.5–5.1)
Sodium: 141 mmol/L (ref 135–145)

## 2017-09-26 LAB — ACETAMINOPHEN LEVEL: Acetaminophen (Tylenol), Serum: <10 ug/mL — ABNORMAL LOW (ref 10–30)

## 2017-09-26 LAB — RAPID URINE DRUG SCREEN, HOSP PERFORMED
Amphetamines: NOT DETECTED
Benzodiazepines: NOT DETECTED
COCAINE: POSITIVE — AB
Opiates: NOT DETECTED
Tetrahydrocannabinol: POSITIVE — AB

## 2017-09-26 LAB — TROPONIN I: Troponin I: <0.03 ng/mL (ref <0.03)

## 2017-09-26 LAB — SALICYLATE LEVEL

## 2017-09-26 LAB — ETHANOL: Alcohol, Ethyl (B): 67 mg/dL — ABNORMAL HIGH (ref <10)

## 2017-09-26 MED ORDER — ALUM & MAG HYDROXIDE-SIMETH 200-200-20 MG/5ML PO SUSP: 30.0000 mL | Freq: Four times a day (QID) | ORAL | Status: DC | PRN

## 2017-09-26 MED ORDER — HALOPERIDOL 5 MG PO TABS
5.0000 mg | ORAL_TABLET | Freq: Two times a day (BID) | ORAL | Status: DC
  Administered 2017-09-26: 5 mg via ORAL
  Filled 2017-09-26: qty 1

## 2017-09-26 MED ORDER — ONDANSETRON HCL 4 MG PO TABS: 4.0000 mg | ORAL_TABLET | Freq: Three times a day (TID) | ORAL | Status: DC | PRN

## 2017-09-26 MED ORDER — IBUPROFEN 200 MG PO TABS: 600.0000 mg | ORAL_TABLET | Freq: Three times a day (TID) | ORAL | Status: DC | PRN

## 2017-09-26 MED ORDER — NICOTINE 21 MG/24HR TD PT24: 21.0000 mg | MEDICATED_PATCH | Freq: Every day | TRANSDERMAL | Status: DC

## 2017-09-26 NOTE — BH Assessment (Addendum)
Assessment Note  Chris [name] is an 32 y.o. male.  -Clinician reviewed note by Dr. Read Drivers.  Chris [name] is a 32 y.o. male with a history of schizophrenia and polysubstance abuse.  He is here requesting help getting off of alcohol, marijuana and cocaine.  He admits to being off of his schizophrenia medications for over a year.  He denies opioid abuse.  He denies withdrawal symptoms or other somatic symptoms at the present time.  He has been hearing voices.  He recognizes the voices as being from people who are not there.  They are not threatening or commanding.  He denies HI or SI  Pt says at the start of interview that his problem is with SA not MH.  He says that he wants inpatient care for his cocaine (crack) addiction.  He reports using crack daily in the amount of around $65 per day.  Patient says he has started stealing to get money for crack and he wants to quit that so he doesn't end up in jail.  Patient also reports drinking five 40's per day on average.  Both the crack and the ETOH use has been at those rates for the last month.  Pt also uses marijuana about every other day.  Patient says that he is not suicidal although he has had multiple attempts in the past.  Pt denies any HI.  He has no visual hallucinations but at times will hear muffled voices.    Patient is depressed about his drug use.  He displays some paranoia.  Has pressured speech and is generally irritable.  He can be redirected with a calm tone.  Clinician talked to him about peer support and he is receptive to the idea of this support.  -Clinician discussed patient care with Nira Conn, FNP.  He recommended peer support see patient to offer assistance with his request.  Diagnosis: F14.20 Cocaine use d/o severe, F10.20 ETOH use d/o; F12.20 Cannabis use d/o severe; F25.0 Schizoaffective d/o bipolar type  Past Medical History:  Past Medical History:  Diagnosis Date  . Anxiety   . Bipolar 2 disorder (HCC)   . Depression    . Schizophrenia (HCC)     History reviewed. No pertinent surgical history.  Family History:  Family History  Problem Relation Age of Onset  . Bipolar disorder Other   . Schizophrenia Other     Social History:  reports that he has been smoking.  He has been smoking about 0.50 packs per day. He uses smokeless tobacco. He reports that he drinks about 1.2 oz of alcohol per week. He reports that he has current or past drug history. Drug: Marijuana.  Additional Social History:  Alcohol / Drug Use Pain Medications: None Prescriptions: None Over the Counter: None History of alcohol / drug use?: Yes Withdrawal Symptoms: Patient aware of relationship between substance abuse and physical/medical complications, Agitation Substance #1 Name of Substance 1: Cocaine (crack) 1 - Age of First Use: 32 years of age 55 - Amount (size/oz): $65 worth per day 1 - Frequency: Daily use 1 - Duration: For the last month 1 - Last Use / Amount: 06/24 Substance #2 Name of Substance 2: ETOH 2 - Age of First Use: 32 years of age 98 - Amount (size/oz): Five 40's per day 2 - Frequency: Daily use 2 - Duration: For the last month 2 - Last Use / Amount: 06/24 Substance #3 Name of Substance 3: Marijuana 3 - Age of First Use: Teens 3 -  Amount (size/oz): Varies 3 - Frequency: Every other day 3 - Duration: on-going 3 - Last Use / Amount: 06/23  CIWA: CIWA-Ar BP: (!) 136/97 Pulse Rate: 80 Nausea and Vomiting: no nausea and no vomiting Tactile Disturbances: none Tremor: no tremor Auditory Disturbances: not present Paroxysmal Sweats: no sweat visible Visual Disturbances: not present Anxiety: no anxiety, at ease Headache, Fullness in Head: none present Agitation: normal activity Orientation and Clouding of Sensorium: oriented and can do serial additions CIWA-Ar Total: 0 COWS: Clinical Opiate Withdrawal Scale (COWS) Resting Pulse Rate: Pulse Rate 81-100 Sweating: No report of chills or  flushing Restlessness: Reports difficulty sitting still, but is able to do so Pupil Size: Pupils possibly larger than normal for room light Bone or Joint Aches: Not present Runny Nose or Tearing: Not present GI Upset: No GI symptoms Tremor: No tremor Yawning: No yawning Anxiety or Irritability: None Gooseflesh Skin: Skin is smooth COWS Total Score: 3  Allergies:  Allergies  Allergen Reactions  . Penicillins Hives and Rash    Has patient had a PCN reaction causing immediate rash, facial/tongue/throat swelling, SOB or lightheadedness with hypotension: YES Has patient had a PCN reaction causing severe rash involving mucus membranes or skin necrosis: YES Has patient had a PCN reaction that required hospitalization NO Has patient had a PCN reaction occurring within the last 10 years:NO If all of the above answers are "NO", then may proceed with Cephalosporin use.     Home Medications:  (Not in a hospital admission)  OB/GYN Status:  No LMP for male patient.  General Assessment Data Location of Assessment: [facility] TTS Assessment: In system Is this a Tele or Face-to-Face Assessment?: Face-to-Face Is this an Initial Assessment or a Re-assessment for this encounter?: Initial Assessment Marital status: Single Is patient pregnant?: No Pregnancy Status: No Living Arrangements: Other relatives(Stays with cousins) Can pt return to current living arrangement?: Yes Admission Status: Voluntary Is patient capable of signing voluntary admission?: Yes Referral Source: Self/Family/Friend[facility] Insurance type: self pay     Crisis Care Plan Living Arrangements: Other relatives(Stays with cousins) Name of Psychiatrist: None Name of Therapist: None  Education Status Is patient currently in school?: No Is the patient employed, unemployed or receiving disability?: Unemployed  Risk to self with the past 6 months Suicidal Ideation: No Has patient been a risk to  self within the past 6 months prior to admission? : No Suicidal Intent: No Has patient had any suicidal intent within the past 6 months prior to admission? : No Is patient at risk for suicide?: No Suicidal Plan?: No Has patient had any suicidal plan within the past 6 months prior to admission? : No Access to Means: No What has been your use of drugs/alcohol within the last 12 months?: Cocaine, ETOH, THC Previous Attempts/Gestures: Yes How many times?: (Multiple times) Other Self Harm Risks: None Triggers for Past Attempts: Unpredictable Intentional Self Injurious Behavior: None Family Suicide History: No Recent stressful life event(s): Turmoil (Comment)(Drug use ) Persecutory voices/beliefs?: Yes Depression: Yes Depression Symptoms: Despondent, Insomnia, Isolating, Guilt, Loss of interest in usual pleasures, Feeling worthless/self pity, Feeling angry/irritable Substance abuse history and/or treatment for substance abuse?: Yes Suicide prevention information given to non-admitted patients: Not applicable  Risk to Others within the past 6 months Homicidal Ideation: No Does patient have any lifetime risk of violence toward others beyond the six months prior to admission? : No Thoughts of Harm to Others: No Current Homicidal Intent: No Current Homicidal Plan:  No Access to Homicidal Means: No Identified Victim: No one History of harm to others?: No Assessment of Violence: None Noted Violent Behavior Description: None reported Does patient have access to weapons?: No Criminal Charges Pending?: No Does patient have a court date: No Is patient on probation?: No  Psychosis Hallucinations: Auditory(Muffled voices at times.) Delusions: None noted  Mental Status Report Appearance/Hygiene: In scrubs, Unremarkable Eye Contact: Good Motor Activity: Freedom of movement, Restlessness Speech: Logical/coherent, Rapid Level of Consciousness: Alert, Irritable Mood: Anxious, Depressed,  Despair Affect: Anxious, Irritable, Depressed Anxiety Level: Minimal Thought Processes: Coherent, Relevant Judgement: Impaired Orientation: Person, Place, Situation, Time Obsessive Compulsive Thoughts/Behaviors: None  Cognitive Functioning Concentration: Normal Memory: Recent Intact, Remote Intact Is patient IDD: No Is patient DD?: Yes Insight: Good Impulse Control: Poor Appetite: Poor Have you had any weight changes? : Loss Amount of the weight change? (lbs): (Unknown.  Using crack more than getting food.) Sleep: Decreased Total Hours of Sleep: (<4H/D) Vegetative Symptoms: None  ADLScreening [facility] Assessment Services) Patient's cognitive ability adequate to safely complete daily activities?: Yes Patient able to express need for assistance with ADLs?: Yes Independently performs ADLs?: Yes (appropriate for developmental age)  Prior Inpatient Therapy Prior Inpatient Therapy: Yes Prior Therapy Dates: 11/2015, 04/2012 Prior Therapy Facilty/Provider(s): [facility] Reason for Treatment: SA, SI  Prior Outpatient Therapy Prior Outpatient Therapy: No Prior Therapy Dates: Did go to [facility] in past Prior Therapy Facilty/Provider(s): [facility] in past Reason for Treatment: Med monitoring Does patient have an ACCT team?: No Does patient have Intensive In-House Services?  : No Does patient have [facility] services? : No Does patient have P4CC services?: No  ADL Screening (condition at time of admission) Patient's cognitive ability adequate to safely complete daily activities?: Yes Is the patient deaf or have difficulty hearing?: No Does the patient have difficulty seeing, even when wearing glasses/contacts?: No Does the patient have difficulty concentrating, remembering, or making decisions?: No Patient able to express need for assistance with ADLs?: Yes Does the patient have difficulty dressing or bathing?: No Independently performs ADLs?: Yes (appropriate for developmental age) Does the  patient have difficulty walking or climbing stairs?: No Weakness of Legs: None Weakness of Arms/Hands: None       Abuse/Neglect Assessment (Assessment to be complete while patient is alone) Abuse/Neglect Assessment Can Be Completed: Yes Physical Abuse: Denies Verbal Abuse: Denies Sexual Abuse: Denies Exploitation of patient/patient's resources: Denies Self-Neglect: Denies     Merchant navy officer (For Healthcare) Does Patient Have a Medical Advance Directive?: No Would patient like information on creating a medical advance directive?: No - Patient declined          Disposition:  Disposition Initial Assessment Completed for this Encounter: Yes Patient referred to: Other (Comment)(Pt to be reviewed by FNP.)  On Site Evaluation by:   Reviewed with Physician:    Alexandria Lodge 09/26/2017 6:41 AM

## 2017-09-26 NOTE — BHH Suicide Risk Assessment (Signed)
Suicide Risk Assessment  Discharge Assessment   [facility] Discharge Suicide Risk Assessment   Principal Problem: Cocaine abuse with cocaine-induced psychotic disorder [facility]) Discharge Diagnoses:  Patient Active Problem List   Diagnosis Date Noted  . Cocaine abuse with cocaine-induced psychotic disorder [facility]) [F14.159] 09/26/2017    Priority: High  . Cocaine use disorder, moderate, dependence ([place]) [F14.20] 11/30/2015  . Alcohol use disorder, moderate, dependence ([place]) [F10.20] 11/30/2015  . Tooth ache [K08.89] 11/30/2015  . Sedative, hypnotic or anxiolytic use disorder, mild, abuse ([place]) [F13.10] 11/30/2015    Total Time spent with patient: 45 minutes  Musculoskeletal: Strength & Muscle Tone: within normal limits Gait & Station: normal Patient leans: N/A  Psychiatric Specialty Exam:   Blood pressure (!) 136/97, pulse 80, temperature 98.5 F (36.9 C), temperature source Oral, resp. rate 18, SpO2 99 %.There is no height or weight on file to calculate BMI.  General Appearance: Casual  Eye Contact::  Good  Speech:  Normal Rate409  Volume:  Normal  Mood:  Euthymic  Affect:  Congruent  Thought Process:  Coherent and Descriptions of Associations: Intact  Orientation:  Full (Time, Place, and Person)  Thought Content:  WDL and Logical  Suicidal Thoughts:  No  Homicidal Thoughts:  No  Memory:  Immediate;   Good Recent;   Good Remote;   Good  Judgement:  Fair  Insight:  Fair  Psychomotor Activity:  Normal  Concentration:  Good  Recall:  Good  Fund of Knowledge:Good  Language: Good  Akathisia:  No  Handed:  Right  AIMS (if indicated):     Assets:  Leisure Time Physical Health Resilience Social Support  Sleep:     Cognition: WNL  ADL's:  Intact   Mental Status Per Nursing Assessment::   On Admission:   32 yo male who presented to the ED after using cocaine and missing his court date today.  Smiles when this was discussed.  NO suicidal /homicidal ideations, hallucinations, or  withdrawal symptoms.  Peer support consult placed and met with the patient, stable for discharge with resources.  Demographic Factors:  Male  Loss Factors: Legal issues  Historical Factors: NA  Risk Reduction Factors:   Sense of responsibility to family, Living with another person, especially a relative, Positive social support and Positive therapeutic relationship  Continued Clinical Symptoms:  None   Cognitive Features That Contribute To Risk:  None    Suicide Risk:  Minimal: No identifiable suicidal ideation.  Patients presenting with no risk factors but with morbid ruminations; may be classified as minimal risk based on the severity of the depressive symptoms    Plan Of Care/Follow-up recommendations:  Activity:  as tolerated Diet:  heart healthy diet  Liticia Gasior, NP 09/26/2017, 11:01 AM

## 2017-09-26 NOTE — BH Assessment (Signed)
[facility]  Per Juanetta BeetsJacqueline Norman, DO, this pt does not require psychiatric hospitalization at this time.  [facility] up with [facility].  This has been included in pt's discharge instructions.  Pt would also benefit from seeing Peer Support Specialists; they will be asked to speak to pt.  Pt's nurse has been notified.  Doylene Canninghomas Sydna Brodowski, MA Triage Specialist [phone]

## 2017-09-26 NOTE — Patient Outreach (Signed)
ED Peer Support Specialist Patient Intake (Complete at intake & 30-60 Day Follow-up)  Name: Chris [name]  MRN: [id number]  Age: 32 y.o.   Date of Admission: 09/26/2017  Intake: Initial Comments:      Primary Reason Admitted:malewith a history of schizophrenia and polysubstance abuse. He is here requesting help getting off of alcohol, marijuana and cocaine. He admits to being off of his schizophrenia medications for over a year. He denies opioid abuse. He denies withdrawal symptoms or other somatic symptoms at the present time. He has been hearing voices. He recognizes the voices as being from people who are not there. They are not threatening or commanding. He denies HI or SI  Pt says at the start of interview that his problem is with SA not MH.  He says that he wants inpatient care for his cocaine (crack) addiction.  He reports using crack daily in the amount of around $65 per day.  Patient says he has started stealing to get money for crack and he wants to quit that so he doesn't end up in jail.  Patient also reports drinking five 40's per day on average.  Both the crack and the ETOH use has been at those rates for the last month.  Pt also uses marijuana about every other day.  Patient says that he is not suicidal although he has had multiple attempts in the past.  Pt denies any HI.  He has no visual hallucinations but at times will hear muffled voices.        Lab values: Alcohol/ETOH: Positive Positive UDS? Yes Amphetamines: No Barbiturates: Yes Benzodiazepines: No Cocaine: Yes Opiates: No Cannabinoids: Yes  Demographic information: Gender: Male Ethnicity: African American Marital Status: Single Insurance Status: Medicaid Control and instrumentation engineer (Work Engineer, agricultural, Sales executive, etc.: No Lives with: Partner/Spouse Living situation: House/Apartment  Reported Patient History: Patient reported health conditions: Anxiety disorders,  Schizoaffective disorder, Bipolar disorder, Depression Patient aware of HIV and hepatitis status: No  In past year, has patient visited ED for any reason? No  Number of ED visits:    Reason(s) for visit:    In past year, has patient been hospitalized for any reason? No  Number of hospitalizations:    Reason(s) for hospitalization:    In past year, has patient been arrested? No  Number of arrests:    Reason(s) for arrest:    In past year, has patient been incarcerated? No  Number of incarcerations:    Reason(s) for incarceration:    In past year, has patient received medication-assisted treatment? No  In past year, patient received the following treatments: Non-residential community treatment  In past year, has patient received any harm reduction services? No  Did this include any of the following?    In past year, has patient received care from a mental health provider for diagnosis other than SUD? No  In past year, is this first time patient has overdosed? No  Number of past overdoses:    In past year, is this first time patient has been hospitalized for an overdose? No  Number of hospitalizations for overdose(s):    Is patient currently receiving treatment for a mental health diagnosis? No  Patient reports experiencing difficulty participating in SUD treatment: No    Most important reason(s) for this difficulty?    Has patient received prior services for treatment? No  In past, patient has received services from following agencies:    Plan of Care:  Suggested follow up at these agencies/treatment  centers: Other (comment), [facility])(wants to get into Rehab services. )  Other information: CPSS talked with Pt to monitor services and to process with Pt about how he is doing and to see if he has any issue to address at the time. CPSS discussed the importance of Pt making CPSS aware of any concerns that maybe a problem to with getting Pt into  treatment services. CPSS was able to gain information as to what reason Pt visited the ER. CPSS talked with Pt about the importance of Pt getting himself services. CPSS was made aware that Pt has pending charges and that he had court today which was a primary reason that Pt was visiting the ER. CPSS addressed the fact that Pt will not be able to get into any treatment facility until he handles the court situation. CPSS left contact information for Pt to contact CPSS after issues are resolved.    Arlys JohnBrian Dima Ferrufino, CPSS  09/26/2017 10:53 AM

## 2017-09-26 NOTE — ED Provider Notes (Signed)
[facility]-EMERGENCY DEPT Provider Note: Lowella DellJ. Lane Daryn Pisani, MD, FACEP  CSN: [id number] MRN: [id number] ARRIVAL: 09/26/17 at 0018 [facility]   CHIEF COMPLAINT  Addiction Problem   HISTORY OF PRESENT ILLNESS  09/26/17 5:37 AM Chris [name] is a 32 y.o. male with a history of schizophrenia and polysubstance abuse.  He is here requesting help getting off of alcohol, marijuana and cocaine.  He admits to being off of his schizophrenia medications for over a year.  He denies opioid abuse.  He denies withdrawal symptoms or other somatic symptoms at the present time.  He has been hearing voices.  He recognizes the voices as being from people who are not there.  They are not threatening or commanding.  He denies HI or SI.   Past Medical History:  Diagnosis Date  . Anxiety   . Bipolar 2 disorder (HCC)   . Depression   . Schizophrenia (HCC)     History reviewed. No pertinent surgical history.  Family History  Problem Relation Age of Onset  . Bipolar disorder Other   . Schizophrenia Other     Social History   Tobacco Use  . Smoking status: Current Every Day Smoker    Packs/day: 0.50  . Smokeless tobacco: Current User  Substance Use Topics  . Alcohol use: Yes    Alcohol/week: 1.2 oz    Types: 2 Standard drinks or equivalent per week  . Drug use: Yes    Types: Marijuana    Prior to Admission medications   Not on File    Allergies Penicillins   REVIEW OF SYSTEMS  Negative except as noted here or in the History of Present Illness.   PHYSICAL EXAMINATION  Initial Vital Signs There were no vitals taken for this visit.  Examination General: Well-developed, well-nourished male in no acute distress; appearance consistent with age of record HENT: normocephalic; atraumatic Eyes: Normal appearance Neck: supple Heart: regular rate and rhythm Lungs: clear to auscultation bilaterally Abdomen: soft; nondistended; nontender; no masses or hepatosplenomegaly; bowel sounds  present Extremities: No deformity; full range of motion Neurologic: Awake, alert and oriented; motor function intact in all extremities and symmetric; no facial droop Skin: Warm and dry Psychiatric: Flat affect; auditory hallucinations; no HI; SI   RESULTS  Summary of this visit's results, reviewed by myself:   EKG Interpretation  Date/Time:  Tuesday September 26 2017 06:02:43 EDT Ventricular Rate:  75 PR Interval:  160 QRS Duration: 94 QT Interval:  416 QTC Calculation: 464 R Axis:   82 Text Interpretation:  Normal sinus rhythm ST elevation, consider early repolarization, pericarditis, or injury Abnormal ECG No significant change was found Confirmed by Paula LibraMolpus, Eliabeth Shoff ([id number]) on 09/26/2017 6:25:26 AM      Laboratory Studies: Results for orders placed or performed during the hospital encounter of 09/26/17 (from the past 24 hour(s))  CBC with Differential     Status: None   Collection Time: 09/26/17  2:04 AM  Result Value Ref Range   WBC 6.7 4.0 - 10.5 K/uL   RBC 5.12 4.22 - 5.81 MIL/uL   Hemoglobin 15.7 13.0 - 17.0 g/dL   HCT 62.146.2 30.839.0 - 65.752.0 %   MCV 90.2 78.0 - 100.0 fL   MCH 30.7 26.0 - 34.0 pg   MCHC 34.0 30.0 - 36.0 g/dL   RDW 84.614.8 96.211.5 - 95.215.5 %   Platelets 186 150 - 400 K/uL   Neutrophils Relative % 67 %   Neutro Abs 4.5 1.7 - 7.7 K/uL   Lymphocytes Relative  26 %   Lymphs Abs 1.7 0.7 - 4.0 K/uL   Monocytes Relative 6 %   Monocytes Absolute 0.4 0.1 - 1.0 K/uL   Eosinophils Relative 1 %   Eosinophils Absolute 0.1 0.0 - 0.7 K/uL   Basophils Relative 0 %   Basophils Absolute 0.0 0.0 - 0.1 K/uL  Basic metabolic panel     Status: None   Collection Time: 09/26/17  2:04 AM  Result Value Ref Range   Sodium 141 135 - 145 mmol/L   Potassium 3.6 3.5 - 5.1 mmol/L   Chloride 106 98 - 111 mmol/L   CO2 26 22 - 32 mmol/L   Glucose, Bld 81 70 - 99 mg/dL   BUN 6 6 - 20 mg/dL   Creatinine, Ser 1.61 0.61 - 1.24 mg/dL   Calcium 9.1 8.9 - 09.6 mg/dL   GFR calc non Af Amer >60 >60  mL/min   GFR calc Af Amer >60 >60 mL/min   Anion gap 9 5 - 15  Ethanol     Status: Abnormal   Collection Time: 09/26/17  2:04 AM  Result Value Ref Range   Alcohol, Ethyl (B) 67 (H) <10 mg/dL  Acetaminophen level     Status: Abnormal   Collection Time: 09/26/17  2:04 AM  Result Value Ref Range   Acetaminophen (Tylenol), Serum <10 (L) 10 - 30 ug/mL  Salicylate level     Status: None   Collection Time: 09/26/17  2:04 AM  Result Value Ref Range   Salicylate Lvl <7.0 2.8 - 30.0 mg/dL  Troponin I     Status: None   Collection Time: 09/26/17  2:04 AM  Result Value Ref Range   Troponin I <0.03 <0.03 ng/mL  Rapid urine drug screen (hospital performed)     Status: Abnormal   Collection Time: 09/26/17  5:35 AM  Result Value Ref Range   Opiates NONE DETECTED NONE DETECTED   Cocaine POSITIVE (A) NONE DETECTED   Benzodiazepines NONE DETECTED NONE DETECTED   Amphetamines NONE DETECTED NONE DETECTED   Tetrahydrocannabinol POSITIVE (A) NONE DETECTED   Barbiturates (A) NONE DETECTED    Result not available. Reagent lot number recalled by manufacturer.   Imaging Studies: No results found.  ED COURSE and MDM  Nursing notes and initial vitals signs, including pulse oximetry, reviewed.  Vitals:   09/26/17 0541 09/26/17 0555  BP: (!) 136/97 (!) 136/97  Pulse: 80 80  Resp:  18  Temp:  98.5 F (36.9 C)  TempSrc:  Oral  SpO2:  99%    PROCEDURES    ED DIAGNOSES     ICD-10-CM   1. Polysubstance abuse (HCC) F19.10   2. Auditory hallucinations R44.0        Wilma Michaelson, Jonny Ruiz, MD 09/26/17 0630

## 2017-09-26 NOTE — Discharge Instructions (Signed)
To help you maintain a sober lifestyle, a substance abuse treatment program may be beneficial to you.  Contact [facility] at your earliest opportunity to ask about enrolling in their program: ° °     [facility] ([facility] °     [address] °     [place], [place] [address] °     [phone] °     New patients are seen at the walk-in clinic every Tuesday from 9:00 am - 12:00 pm °

## 2017-09-26 NOTE — ED Triage Notes (Addendum)
Pt requesting detox tx resources. Pt admits to using alcohol, marijuana, and cocaine. Pt also states he has schizophrenia and has been off his medications for a year.
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
## 2018-05-29 IMAGING — CT CT FOOT*R* W/O CM
4 of 9 series · 11 of 36 positions shown, 12 images · non-contrast
Comparison: Right ankle radiographs performed 09/14/2016

CLINICAL DATA: Evaluate right calcaneal fracture identified on
radiograph. Initial encounter.

EXAM:
CT OF THE RIGHT FOOT WITHOUT CONTRAST
TECHNIQUE: Multidetector CT imaging of the right foot was performed according
to the standard protocol. Multiplanar CT image reconstructions were
also generated.

[Series 4: foot/ankle bone windows · axial · 0.52mm/px · z∈[-1316,-1266]mm · 2 of 75 slices shown, 3 images]
[im 25/75  soft-tissue]
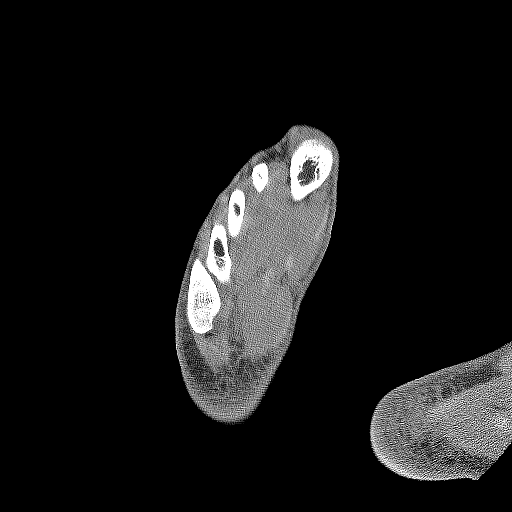
[im 25/75  bone]
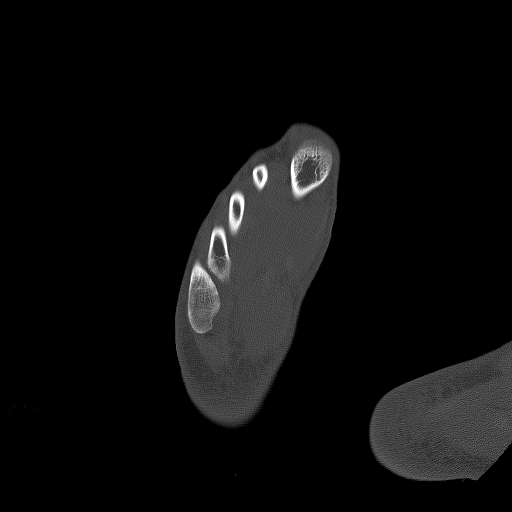
[im 50/75  bone]
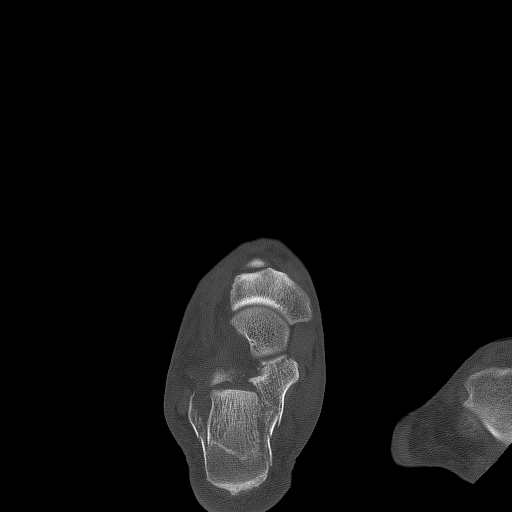

[Series 5: foot/ankle st · axial · 0.52mm/px · z∈[-1316,-1266]mm · 2 of 75 slices shown]
[im 25/75  bone]
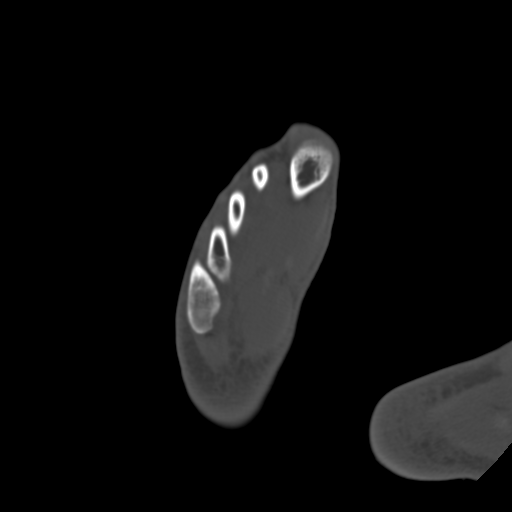
[im 50/75  bone]
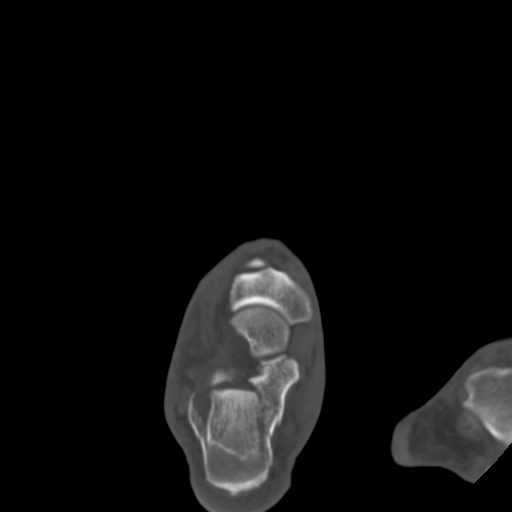

[Series 6: coronal bone · coronal · 0.20mm/px · 3 of 130 slices shown]
[im 39/130  bone]
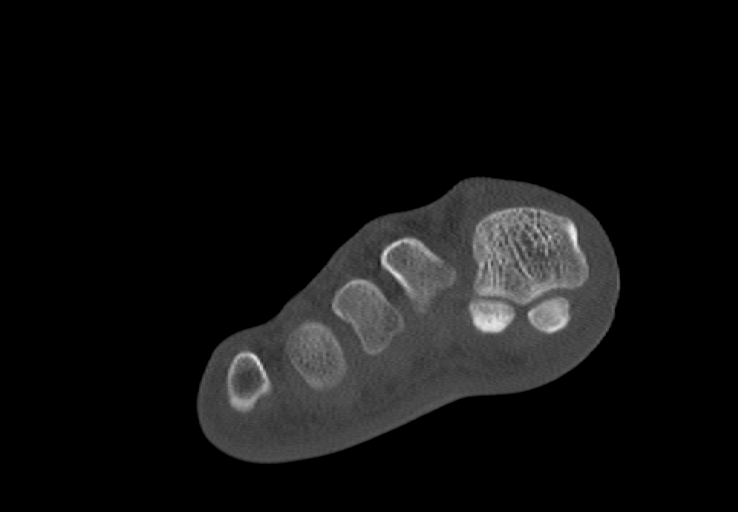
[im 65/130  bone]
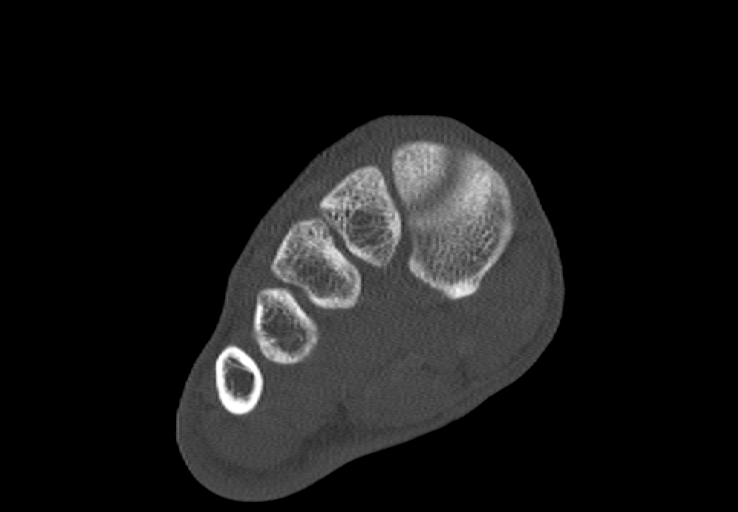
[im 92/130  bone]
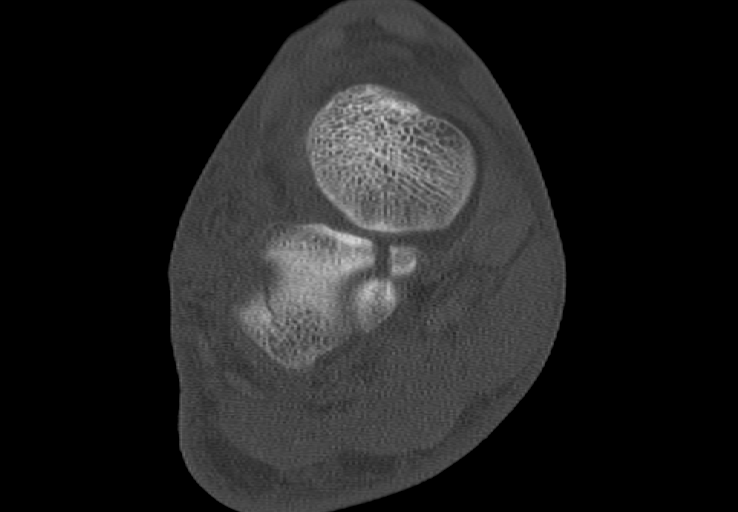

[Series 7: sagittal bone · sagittal · 0.21mm/px · 4 of 45 slices shown]
[im 12/45  bone]
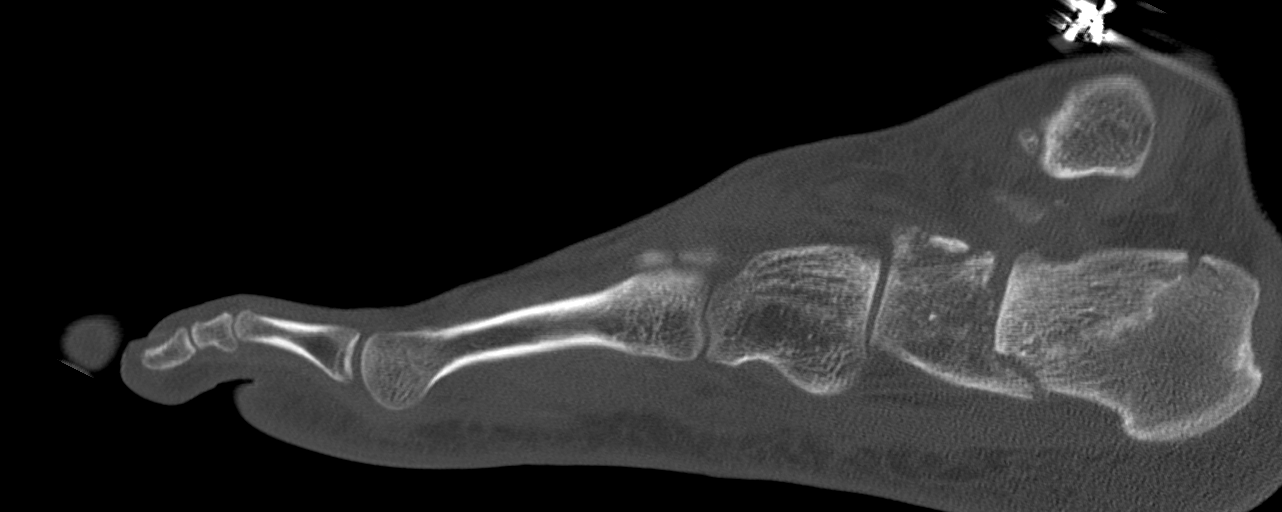
[im 14/45  soft-tissue]
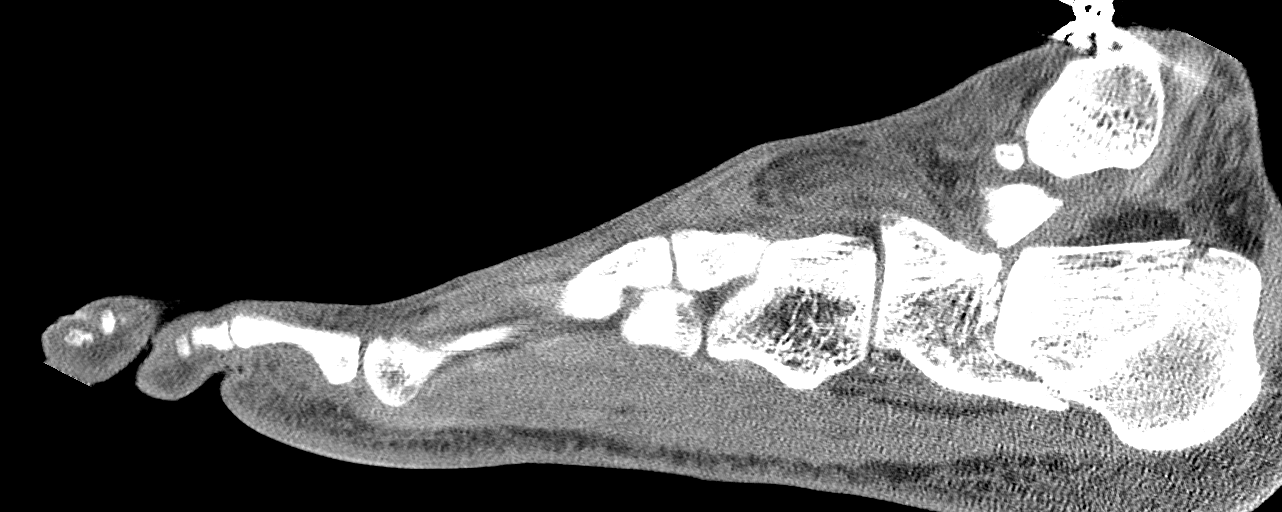
[im 23/45  bone]
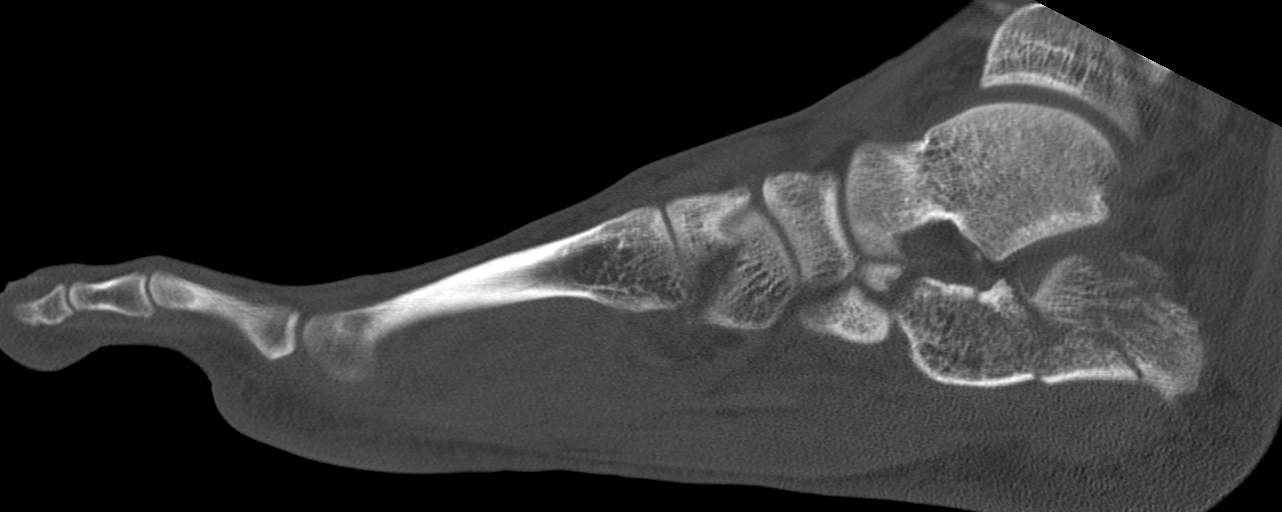
[im 34/45  bone]
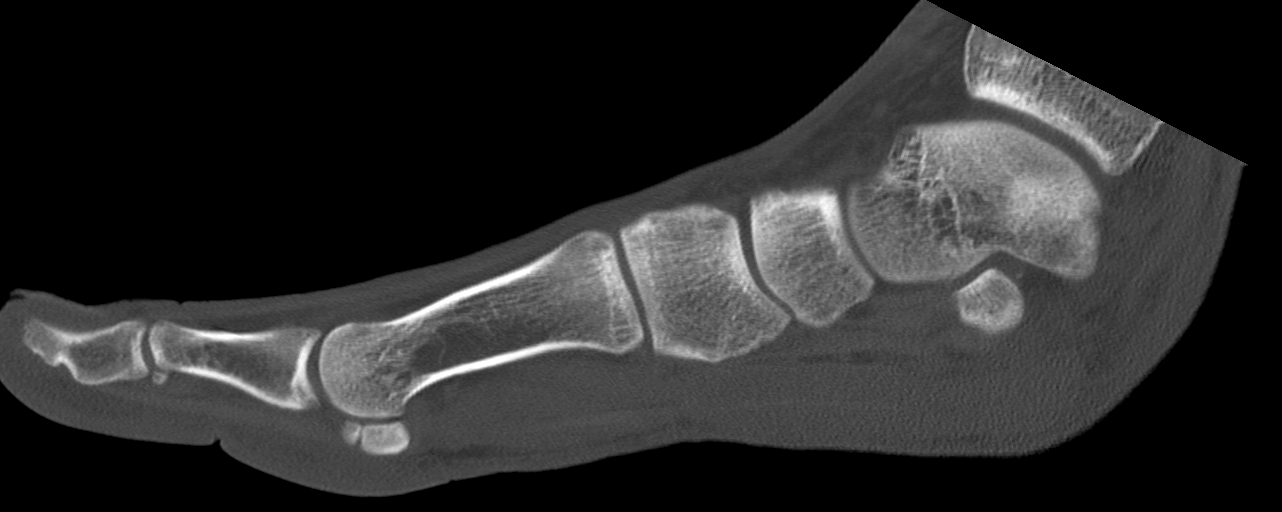

[11 of 36 positions shown; findings below may reference images not displayed]

FINDINGS: Bones/Joint/Cartilage

There is a comminuted joint depression type fracture of the
calcaneus. This appears to reflect Hebil Natal type 3AC fracture, with
an additional oblique fracture line extending across the base of the
sustentaculum tali. There is underlying comminution about the
primary fracture lines. There is anterior rotation of the calcaneal
fragment containing the articular surface of the posterior facet.

No additional fractures are seen. There is a bipartite medial
sesamoid of the first toe. The talar dome is grossly unremarkable. A
few small loose bodies are seen adjacent to the distal fibula. No
significant joint effusion is seen.

Ligaments

Suboptimally assessed by CT.

Muscles and Tendons

The visualized musculature is grossly unremarkable. Flexor and
peroneal tendons track directly adjacent to the fracture sites.
Extensor tendons are grossly unremarkable.

Soft tissues

Soft tissue injury is seen tracking about the dorsum of the hindfoot
and midfoot. The sinus tarsi is grossly unremarkable in appearance.
IMPRESSION: 1. Comminuted joint depression type fracture of the calcaneus. This
appears to reflect Hebil Natal type 3AC fracture, with additional
oblique fracture line extending across the base of the sustentaculum
tali. Underlying comminution about the primary fracture lines, and
anterior rotation of the calcaneal fragment containing the articular
surface of the posterior facet.
2. Soft tissue injury about the dorsum of the hindfoot and midfoot.
Flexor and peroneal tendons track directly adjacent to the fracture
sites, though they appear grossly intact.
3. Bipartite medial sesamoid of the first toe.
4. Small loose bodies noted adjacent to the distal fibula.

## 2018-06-04 ENCOUNTER — Encounter (HOSPITAL_COMMUNITY): Payer: Self-pay

## 2018-06-04 ENCOUNTER — Emergency Department (HOSPITAL_COMMUNITY)
Admission: EM | Admit: 2018-06-04 | Discharge: 2018-06-05 | Disposition: A | Discharge status: Other Acute Inpatient Hospital | Payer: Self-pay | Attending: Emergency Medicine | Admitting: Emergency Medicine

## 2018-06-04 ENCOUNTER — Other Ambulatory Visit: Payer: Self-pay

## 2018-06-04 DIAGNOSIS — F23 Brief psychotic disorder: Secondary | ICD-10-CM | POA: Diagnosis present

## 2018-06-04 DIAGNOSIS — F17200 Nicotine dependence, unspecified, uncomplicated: Secondary | ICD-10-CM | POA: Insufficient documentation

## 2018-06-04 DIAGNOSIS — Z9114 Patient's other noncompliance with medication regimen: Secondary | ICD-10-CM | POA: Insufficient documentation

## 2018-06-04 DIAGNOSIS — Z91148 Patient's other noncompliance with medication regimen for other reason: Secondary | ICD-10-CM

## 2018-06-04 DIAGNOSIS — F250 Schizoaffective disorder, bipolar type: Secondary | ICD-10-CM | POA: Diagnosis present

## 2018-06-04 DIAGNOSIS — F309 Manic episode, unspecified: Secondary | ICD-10-CM

## 2018-06-04 DIAGNOSIS — F419 Anxiety disorder, unspecified: Secondary | ICD-10-CM | POA: Insufficient documentation

## 2018-06-04 DIAGNOSIS — Z046 Encounter for general psychiatric examination, requested by authority: Secondary | ICD-10-CM | POA: Insufficient documentation

## 2018-06-04 DIAGNOSIS — R440 Auditory hallucinations: Secondary | ICD-10-CM
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
## 2018-06-04 NOTE — ED Notes (Signed)
Bed: [facility] Expected date:  Expected time:  Means of arrival:  Comments: 33 yo M/psych

## 2018-06-05 ENCOUNTER — Encounter: Payer: Self-pay | Admitting: *Deleted

## 2018-06-05 ENCOUNTER — Inpatient Hospital Stay
Admission: AD | Admit: 2018-06-05 | Discharge: 2018-06-19 | DRG: 885 | Disposition: A | Discharge status: Home / Self Care | Payer: No Typology Code available for payment source | Source: Intra-hospital | Attending: Psychiatry | Admitting: Psychiatry

## 2018-06-05 DIAGNOSIS — F250 Schizoaffective disorder, bipolar type: Secondary | ICD-10-CM | POA: Diagnosis present

## 2018-06-05 DIAGNOSIS — F1490 Cocaine use, unspecified, uncomplicated: Secondary | ICD-10-CM | POA: Diagnosis present

## 2018-06-05 DIAGNOSIS — F419 Anxiety disorder, unspecified: Secondary | ICD-10-CM | POA: Diagnosis present

## 2018-06-05 DIAGNOSIS — M62838 Other muscle spasm: Secondary | ICD-10-CM | POA: Diagnosis not present

## 2018-06-05 DIAGNOSIS — F1290 Cannabis use, unspecified, uncomplicated: Secondary | ICD-10-CM | POA: Diagnosis present

## 2018-06-05 DIAGNOSIS — G4700 Insomnia, unspecified: Secondary | ICD-10-CM | POA: Diagnosis present

## 2018-06-05 DIAGNOSIS — Z590 Homelessness: Secondary | ICD-10-CM

## 2018-06-05 DIAGNOSIS — Z880 Allergy status to penicillin: Secondary | ICD-10-CM

## 2018-06-05 DIAGNOSIS — Z791 Long term (current) use of non-steroidal anti-inflammatories (NSAID): Secondary | ICD-10-CM

## 2018-06-05 DIAGNOSIS — F17210 Nicotine dependence, cigarettes, uncomplicated: Secondary | ICD-10-CM | POA: Diagnosis present

## 2018-06-05 DIAGNOSIS — Z813 Family history of other psychoactive substance abuse and dependence: Secondary | ICD-10-CM | POA: Diagnosis not present

## 2018-06-05 DIAGNOSIS — Z9114 Patient's other noncompliance with medication regimen: Secondary | ICD-10-CM

## 2018-06-05 LAB — RAPID URINE DRUG SCREEN, HOSP PERFORMED
Amphetamines: NOT DETECTED
BARBITURATES: NOT DETECTED
Benzodiazepines: POSITIVE — AB
COCAINE: NOT DETECTED
Opiates: NOT DETECTED
Tetrahydrocannabinol: NOT DETECTED

## 2018-06-05 LAB — CBC WITH DIFFERENTIAL/PLATELET
Abs Immature Granulocytes: 0.09 10*3/uL — ABNORMAL HIGH (ref 0.00–0.07)
BASOS PCT: 0 %
Basophils Absolute: 0 10*3/uL (ref 0.0–0.1)
EOS PCT: 0 %
Eosinophils Absolute: 0 10*3/uL (ref 0.0–0.5)
HEMATOCRIT: 41.3 % (ref 39.0–52.0)
Hemoglobin: 13.6 g/dL (ref 13.0–17.0)
IMMATURE GRANULOCYTES: 1 %
LYMPHS ABS: 1.8 10*3/uL (ref 0.7–4.0)
Lymphocytes Relative: 12 %
MCH: 30.2 pg (ref 26.0–34.0)
MCHC: 32.9 g/dL (ref 30.0–36.0)
MCV: 91.8 fL (ref 80.0–100.0)
MONO ABS: 1 10*3/uL (ref 0.1–1.0)
MONOS PCT: 7 %
NEUTROS PCT: 80 %
Neutro Abs: 11.7 10*3/uL — ABNORMAL HIGH (ref 1.7–7.7)
PLATELETS: 209 10*3/uL (ref 150–400)
RBC: 4.5 MIL/uL (ref 4.22–5.81)
RDW: 14 % (ref 11.5–15.5)
WBC: 14.6 10*3/uL — ABNORMAL HIGH (ref 4.0–10.5)
nRBC: 0 % (ref 0.0–0.2)

## 2018-06-05 LAB — LIPID PANEL
CHOLESTEROL: 111 mg/dL (ref 0–200)
HDL: 38 mg/dL — ABNORMAL LOW (ref >40)
LDL Cholesterol: 60 mg/dL (ref 0–99)
Total CHOL/HDL Ratio: 2.9 RATIO
Triglycerides: 66 mg/dL (ref <150)
VLDL: 13 mg/dL (ref 0–40)

## 2018-06-05 LAB — COMPREHENSIVE METABOLIC PANEL
ALBUMIN: 4.4 g/dL (ref 3.5–5.0)
ALK PHOS: 72 U/L (ref 38–126)
ALT: 24 U/L (ref 0–44)
ANION GAP: 11 (ref 5–15)
AST: 41 U/L (ref 15–41)
BUN: 19 mg/dL (ref 6–20)
CALCIUM: 9.2 mg/dL (ref 8.9–10.3)
CHLORIDE: 107 mmol/L (ref 98–111)
CO2: 23 mmol/L (ref 22–32)
Creatinine, Ser: 1.14 mg/dL (ref 0.61–1.24)
GFR calc Af Amer: >60 mL/min (ref >60)
GFR calc non Af Amer: >60 mL/min (ref >60)
GLUCOSE: 88 mg/dL (ref 70–99)
Potassium: 4.1 mmol/L (ref 3.5–5.1)
Sodium: 141 mmol/L (ref 135–145)
Total Bilirubin: 1.4 mg/dL — ABNORMAL HIGH (ref 0.3–1.2)
Total Protein: 7.3 g/dL (ref 6.5–8.1)

## 2018-06-05 LAB — SALICYLATE LEVEL: Salicylate Lvl: <7 mg/dL (ref 2.8–30.0)

## 2018-06-05 LAB — ETHANOL: Alcohol, Ethyl (B): <10 mg/dL (ref <10)

## 2018-06-05 LAB — ACETAMINOPHEN LEVEL: Acetaminophen (Tylenol), Serum: <10 ug/mL — ABNORMAL LOW (ref 10–30)

## 2018-06-05 LAB — TSH: TSH: 1.088 u[IU]/mL (ref 0.350–4.500)

## 2018-06-05 MED ORDER — LORAZEPAM 1 MG PO TABS
1.0000 mg | ORAL_TABLET | ORAL | Status: DC | PRN
  Administered 2018-06-05: 1 mg via ORAL
  Filled 2018-06-05: qty 1

## 2018-06-05 MED ORDER — HYDROXYZINE HCL 25 MG PO TABS: 25.0000 mg | ORAL_TABLET | Freq: Four times a day (QID) | ORAL | Status: DC | PRN

## 2018-06-05 MED ORDER — HYDROXYZINE HCL 25 MG PO TABS
50.0000 mg | ORAL_TABLET | Freq: Once | ORAL | Status: AC
  Administered 2018-06-05: 50 mg via ORAL
  Filled 2018-06-05: qty 2

## 2018-06-05 MED ORDER — OLANZAPINE 5 MG PO TBDP: 10.0000 mg | ORAL_TABLET | Freq: Three times a day (TID) | ORAL | Status: DC | PRN

## 2018-06-05 MED ORDER — LORAZEPAM 1 MG PO TABS
1.0000 mg | ORAL_TABLET | ORAL | Status: DC | PRN
  Administered 2018-06-06: 1 mg via ORAL
  Filled 2018-06-05: qty 1

## 2018-06-05 MED ORDER — MAGNESIUM HYDROXIDE 400 MG/5ML PO SUSP: 30.0000 mL | Freq: Every day | ORAL | Status: DC | PRN

## 2018-06-05 MED ORDER — ACETAMINOPHEN 325 MG PO TABS: 650.0000 mg | ORAL_TABLET | Freq: Four times a day (QID) | ORAL | Status: DC | PRN

## 2018-06-05 MED ORDER — ALUM & MAG HYDROXIDE-SIMETH 200-200-20 MG/5ML PO SUSP: 30.0000 mL | ORAL | Status: DC | PRN

## 2018-06-05 MED ORDER — OLANZAPINE 10 MG IM SOLR
10.0000 mg | Freq: Once | INTRAMUSCULAR | Status: AC
  Administered 2018-06-05: 10 mg via INTRAMUSCULAR
  Filled 2018-06-05: qty 10

## 2018-06-05 MED ORDER — LORAZEPAM 1 MG PO TABS: 1.0000 mg | ORAL_TABLET | ORAL | Status: DC | PRN

## 2018-06-05 MED ORDER — STERILE WATER FOR INJECTION IJ SOLN
INTRAMUSCULAR | Status: AC
  Administered 2018-06-05: 2.1 mL
  Filled 2018-06-05: qty 10

## 2018-06-05 MED ORDER — DIPHENHYDRAMINE HCL 25 MG PO CAPS
50.0000 mg | ORAL_CAPSULE | Freq: Every evening | ORAL | Status: DC | PRN
  Administered 2018-06-06 – 2018-06-07 (×2): 50 mg via ORAL
  Filled 2018-06-05 (×2): qty 2

## 2018-06-05 MED ORDER — IBUPROFEN 200 MG PO TABS: 400.0000 mg | ORAL_TABLET | Freq: Two times a day (BID) | ORAL | Status: DC | PRN

## 2018-06-05 MED ORDER — ZIPRASIDONE MESYLATE 20 MG IM SOLR: 20.0000 mg | INTRAMUSCULAR | Status: DC | PRN

## 2018-06-05 NOTE — ED Notes (Signed)
Pt changed into scrubs and report being called to [facility]. Pt agitated at this time.

## 2018-06-05 NOTE — ED Notes (Signed)
Pt presents from main ed, agitated, manic, restless, repeatedly pressing on the call bells, doing pushups, spinning the chair in his room, chair had to be removed from room.  Pt awake, alert & responsive.  PA Nira Conn called for medication orders.  Meds given without incident, pt tolerated well.  Pt very agitated upon arrival to ED, given Versed in the field.  Monitoring for safety, Q 15 min checks in effect.

## 2018-06-05 NOTE — BH Assessment (Signed)
[facility] Assessment Progress Note  Per Juanetta Beets, DO, this pt requires psychiatric hospitalization.  Per Robinette Haines, Counselor pt has been accepted to [facility] by Dr Viviano Simas to the service of Dr Jennet Maduro, Rm [phone].  Dr Sharma Covert also finds that pt meets criteria for IVC, which she has initiated.  IVC documents have been faxed to [facility], and at 14:10 [employer] confirms receipt.  She has since faxed Findings and Custody Order to this Clinical research associate.  At 14:25 I called [employer] and spoke to [employer], who took demographic information, agreeing to dispatch law enforcement to fill out Return of Service.  [place] police then presented at [facility], completing Return of Service, after which IVC documents were faxed to [facility], with [place] confirming receipt.  EDP Kristine Royal, MD agrees with disposition.  Pt's nurse, Lanora Manis, has been notified, and agrees to call report to [phone].  Pt is to be transported via [facility].   Doylene Canning, [place] [facility] Coordinator [phone]

## 2018-06-05 NOTE — ED Notes (Signed)
Pt ambulated to BR with an unsteady gait.

## 2018-06-05 NOTE — Plan of Care (Signed)
  Problem: Education: Goal: Knowledge of [facility] information/materials will improve Note:  Patient instrucrted on unit programing , [facility] , able to verbalize  understanding
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
## 2018-06-05 NOTE — ED Notes (Addendum)
Pt continually walking away from [place] [facility], mumbling. Pt told to go back to [place] [facility].

## 2018-06-05 NOTE — ED Notes (Signed)
Pt urinated on the floor, will re-attempt a urine sample

## 2018-06-05 NOTE — ED Notes (Addendum)
Pt continually getting out of bed but has been redirected each time back to bed and told to not wonder away from his bed.

## 2018-06-05 NOTE — ED Notes (Signed)
Pt has attempted to leave multiple times, pacing back and forth and sentences are not coherent. Flight of ideas. Attempted to redirect pt multiple times.

## 2018-06-05 NOTE — ED Notes (Signed)
PT given sandwich and drink.

## 2018-06-05 NOTE — ED Notes (Signed)
Patient is alert and oriented to person, place and time.  Presents with flat affect and depressed mood.  Minimal interaction during assessment.  Routine safety checks maintained every 15 minutes and via security camera.  Verbalizes understanding of discharge instruction and process.  Report no issues or concern.

## 2018-06-05 NOTE — Progress Notes (Signed)
D:  Patient is cooperative.  He remains disorganized with rapid, pressured speech.  Patient is a poor historian and getting logical details from him at this point is difficult.  He has showered and is currently resting quietly in his room.  Patient came in with a large plastic bin of clothing.  The bin had old food and smelly clothing inside.  He states he "just got out of [place].  A: Continue to monitor medication management and MD orders.  Safety checks completed every 15 minutes per protocol.  Offer support and encouragement as needed.  R:Patient is isolative and withdrawn.
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
## 2018-06-05 NOTE — Tx Team (Signed)
Initial Treatment Plan 06/05/2018 5:32 PM [name] PZW:[id number]    PATIENT STRESSORS: Financial difficulties Loss of Homeless Medication change or noncompliance Substance abuse   PATIENT STRENGTHS: Ability for insight Average or above average intelligence Capable of independent living   PATIENT IDENTIFIED PROBLEMS: Suicidal 06/05/2018  Psychosis 06/05/2018  Depression  06/05/2018                 DISCHARGE CRITERIA:  Ability to meet basic life and health needs Improved stabilization in mood, thinking, and/or behavior  PRELIMINARY DISCHARGE PLAN: Outpatient therapy Return to previous living arrangement  PATIENT/FAMILY INVOLVEMENT: This treatment plan has been presented to and reviewed with the patient, Chris [name], and/or family member,  .  The patient and family have been given the opportunity to ask questions and make suggestions.  Crist Infante, RN 06/05/2018, 5:32 PM
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
## 2018-06-05 NOTE — ED Provider Notes (Signed)
[facility]-EMERGENCY DEPT Provider Note   CSN: [id number] Arrival date & time: 06/04/18  2304    History   Chief Complaint Chief Complaint  Patient presents with  . Medical Clearance    HPI Chris [name] is a 33 y.o. male.     Chris [name] is a 33 y.o. male with a history of schizophrenia, bipolar disorder, depression anxiety, who presents to the emergency department valuation after patient was found at the bus stop trying to jump in front of cars.  On scene patient was noted to be manic with rapid pressured speech and tangential thinking with flight of ideas.  Patient denies any HI or SI, received 5 mg of Versed with EMS and was calm on arrival.  Patient repeatedly muttering to himself.  When asked why he is here today he reports he does not know and speaks in a very quiet voice.  History is limited from the patient.  He is not complaining of any focal pain or medical complaints.  Patient reports he has not been taking his medications.  Patient neither endorses nor denies substance abuse.     Past Medical History:  Diagnosis Date  . Anxiety   . Bipolar 2 disorder (HCC)   . Depression   . Schizophrenia [facility])     Patient Active Problem List   Diagnosis Date Noted  . 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 Cocaine use disorder, moderate, dependence (HCC) 11/30/2015  . Alcohol use disorder, moderate, dependence (HCC) 11/30/2015  . Tooth ache 11/30/2015  . 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 No pertinent surgical history.      Home Medications    Prior to Admission medications   Not on File    Family History Family History  Problem Relation Age of Onset  . Bipolar disorder Other   . Schizophrenia Other     Social History Social History   Tobacco Use  . Smoking status: Current Every Day Smoker    Packs/day: 0.50  . Smokeless tobacco:  Current User  Substance Use Topics  . Alcohol use: Yes    Alcohol/week: 2.0 standard drinks    Types: 2 Standard drinks or equivalent per week  . Drug use: Yes    Types: Marijuana     Allergies   Penicillins   Review of Systems Review of Systems  Unable to perform ROS: Psychiatric disorder     Physical Exam Updated Vital Signs BP 108/64 (BP Location: Left Arm)   Pulse (!) 103   Resp 16   Ht  (1.727 m)   Wt 86.2 kg   SpO2 97%   BMI 28.89 kg/m   Physical Exam Vitals signs and nursing note reviewed.  Constitutional:      General: He is not in acute distress.    Appearance: He is well-developed. He is not diaphoretic.  HENT:     Head: Normocephalic and atraumatic.  Eyes:     General:        Right eye: No discharge.        Left eye: No discharge.     Pupils: Pupils are equal, round, and reactive to light.  Neck:     Musculoskeletal: Neck supple.  Cardiovascular:     Rate and Rhythm: Normal rate and regular rhythm.     Heart sounds: Normal heart sounds.  Pulmonary:     Effort: Pulmonary effort is normal. No respiratory distress.  Breath sounds: Normal breath sounds. No wheezing or rales.     Comments: Respirations equal and unlabored, patient able to speak in full sentences, lungs clear to auscultation bilaterally Abdominal:     General: Bowel sounds are normal. There is no distension.     Palpations: Abdomen is soft. There is no mass.     Tenderness: There is no abdominal tenderness. There is no guarding.     Comments: Abdomen soft, nondistended, nontender to palpation in all quadrants without guarding or peritoneal signs  Musculoskeletal:        General: No deformity.  Skin:    General: Skin is warm and dry.     Capillary Refill: Capillary refill takes less than 2 seconds.  Neurological:     Mental Status: He is alert.     Coordination: Coordination normal.     Comments: Speech is clear, able to follow commands CN III-XII intact Normal strength  in upper and lower extremities bilaterally including dorsiflexion and plantar flexion, strong and equal grip strength Sensation normal to light and sharp touch Moves extremities without ataxia, coordination intact   Psychiatric:        Attention and Perception: He perceives auditory hallucinations. He does not perceive visual hallucinations.        Mood and Affect: Mood is anxious. Affect is labile.        Speech: Speech is rapid and pressured.        Behavior: Behavior is hyperactive.        Thought Content: Thought content does not include homicidal or suicidal ideation. Thought content does not include homicidal or suicidal plan.      ED Treatments / Results  Labs (all labs ordered are listed, but only abnormal results are displayed) Labs Reviewed  COMPREHENSIVE METABOLIC PANEL - Abnormal; Notable for the following components:      Result Value   Total Bilirubin 1.4 (*)    All other components within normal limits  RAPID URINE DRUG SCREEN, HOSP PERFORMED - Abnormal; Notable for the following components:   Benzodiazepines POSITIVE (*)    All other components within normal limits  CBC WITH DIFFERENTIAL/PLATELET - Abnormal; Notable for the following components:   WBC 14.6 (*)    Neutro Abs 11.7 (*)    Abs Immature Granulocytes 0.09 (*)    All other components within normal limits  ACETAMINOPHEN LEVEL - Abnormal; Notable for the following components:   Acetaminophen (Tylenol), Serum <10 (*)    All other components within normal limits  ETHANOL  SALICYLATE LEVEL    EKG EKG Interpretation  Date/Time:  Tuesday June 05 2018 01:03:32 EST Ventricular Rate:  87 PR Interval:  160 QRS Duration: 88 QT Interval:  380 QTC Calculation: 457 R Axis:   83 Text Interpretation:  Normal sinus rhythm Normal ECG When compared with ECG of 09/26/2017, No significant change was found Confirmed by Dione Booze ([id number]) on 06/05/2018 1:15:42 AM   Radiology No results  found.  Procedures Procedures (including critical care time)  Medications Ordered in ED Medications  LORazepam (ATIVAN) tablet 1 mg (1 mg Oral Given 06/05/18 0435)  hydrOXYzine (ATARAX/VISTARIL) tablet 25 mg (has no administration in time range)  hydrOXYzine (ATARAX/VISTARIL) tablet 50 mg (50 mg Oral Given 06/05/18 0206)  OLANZapine (ZYPREXA) injection 10 mg (10 mg Intramuscular Given 06/05/18 0433)  sterile water (preservative free) injection (2.1 mLs  Given 06/05/18 0434)     Initial Impression / Assessment and Plan / ED Course  I have reviewed the  triage vital signs and the nursing notes.  Pertinent labs & imaging results that were available during my care of the patient were reviewed by me and considered in my medical decision making (see chart for details).  Patient presents via EMS for psychiatric evaluation.  Patient was found at a bus stop trying to jump into cars, on arrival EMS noted that he had rapid pressured speech and appeared to be muttering to himself and responding to internal stimuli.  History is very limited from the patient, he has normal vitals and appears to be well and in no acute distress.  Speaking to himself quietly and having difficulty answering any questions.  Given patient's psychiatric history concerning for manic episode versus psychosis.  Feel patient will benefit from acute psychiatric intervention.  Will get medical screening labs and EKG.  EKG shows no concerning changes with normal sinus rhythm.  Labs show a leukocytosis of 14.6, but patient is not having any focal infectious symptoms, normal hemoglobin, no acute electrolyte derangements, normal renal and liver function, negative acetaminophen, salicylate and ethanol levels.  UDS positive for benzodiazepines but otherwise negative.  Patient is medically cleared at this time awaiting TTS evaluation, patient placed under ED psych hold.  TTS recommends observation and reevaluation with psychiatry in the morning.   Patient given hydroxyzine and Ativan for agitation.  Final Clinical Impressions(s) / ED Diagnoses   Final diagnoses:  Manic episode [facility])  Auditory hallucinations    ED Discharge Orders    None       Legrand Rams 06/05/18 6945    Dione Booze, MD 06/05/18 2252

## 2018-06-05 NOTE — ED Notes (Signed)
Pt has been wanded by security. 

## 2018-06-05 NOTE — ED Notes (Addendum)
Pt repeatedly standing up and down, mumbling and speaking fast to himself. Security standing by. Pt changing into burgandy scrubs and pt belongings taken and labeled.

## 2018-06-05 NOTE — BH Assessment (Signed)
Patient has been accepted to [facility].  Accepting physician is Dr. Viviano Simas.  Attending Physician will be Dr. Jennet Maduro.  Patient has been assigned to room 306-A, by [facility] Charge Nurse [facility].  Call report to [phone].  Representative/Transfer Coordinator is Bryanna Yim Patient pre-admitted by [facility] Patient Access Inez Pilgrim)  [facility] ER Staff Maisie Fus H., TTS) made aware of acceptance.

## 2018-06-05 NOTE — ED Notes (Addendum)
Pt getting out of bed and attempting to leave. Pt has been asked to remain in bed until further instruction. Pt redirectable at this time.

## 2018-06-06 DIAGNOSIS — F250 Schizoaffective disorder, bipolar type: Principal | ICD-10-CM

## 2018-06-06 LAB — HEMOGLOBIN A1C
Hgb A1c MFr Bld: 5.6 % (ref 4.8–5.6)
Mean Plasma Glucose: 114.02 mg/dL

## 2018-06-06 MED ORDER — PALIPERIDONE PALMITATE ER 156 MG/ML IM SUSY
156.0000 mg | PREFILLED_SYRINGE | Freq: Once | INTRAMUSCULAR | Status: AC
  Administered 2018-06-10: 156 mg via INTRAMUSCULAR
  Filled 2018-06-06 (×2): qty 1

## 2018-06-06 MED ORDER — PALIPERIDONE PALMITATE ER 234 MG/1.5ML IM SUSY
234.0000 mg | PREFILLED_SYRINGE | INTRAMUSCULAR | Status: DC
  Administered 2018-06-06: 234 mg via INTRAMUSCULAR
  Filled 2018-06-06: qty 1.5

## 2018-06-06 MED ORDER — RISPERIDONE 1 MG PO TABS
2.0000 mg | ORAL_TABLET | Freq: Two times a day (BID) | ORAL | Status: DC
  Filled 2018-06-06: qty 2

## 2018-06-06 MED ORDER — DIVALPROEX SODIUM 500 MG PO DR TAB
500.0000 mg | DELAYED_RELEASE_TABLET | Freq: Three times a day (TID) | ORAL | Status: DC
  Administered 2018-06-06: 500 mg via ORAL
  Filled 2018-06-06 (×3): qty 1

## 2018-06-06 NOTE — BHH Group Notes (Signed)
LCSW Group Therapy Note  06/06/2018 12:34 PM  Type of Therapy/Topic:  Group Therapy:  Emotion Regulation  Participation Level:  Active   Description of Group:   The purpose of this group is to assist patients in learning to regulate negative emotions and experience positive emotions. Patients will be guided to discuss ways in which they have been vulnerable to their negative emotions. These vulnerabilities will be juxtaposed with experiences of positive emotions or situations, and patients will be challenged to use positive emotions to combat negative ones. Special emphasis will be placed on coping with negative emotions in conflict situations, and patients will process healthy conflict resolution skills.  Therapeutic Goals: 1. Patient will identify two positive emotions or experiences to reflect on in order to balance out negative emotions 2. Patient will label two or more emotions that they find the most difficult to experience 3. Patient will demonstrate positive conflict resolution skills through discussion and/or role plays  Summary of Patient Progress: Pt was appropriate and respectful in group. Pt reported that he often sets boundaries, keeps busy, and remains patient to deal with his feelings and emotions. Pt reported that he does not trust women. Pt often spoke very fast and in a low tone and it was hard to decipher what pt was saying.   Therapeutic Modalities:   Cognitive Behavioral Therapy Feelings Identification Dialectical Behavioral Therapy   Iris Pert, MSW, LCSW Clinical Social Work 06/06/2018 12:34 PM

## 2018-06-06 NOTE — Progress Notes (Signed)
Patient slept through the without any interruptions stable and no distress, safety is maintained.

## 2018-06-06 NOTE — BHH Group Notes (Signed)
BHH Group Notes:  (Nursing/MHT/Case Management/Adjunct)  Date:  06/06/2018  Time:  9:08 PM  Type of Therapy:  Group Therapy  Participation Level:  Active  Participation Quality:  Appropriate  Affect:  Appropriate  Cognitive:  Appropriate  Insight:  Appropriate  Engagement in Group:  Engaged  Modes of Intervention:  Discussion  Summary of Progress/Problems:  Chris [name] 06/06/2018, 9:08 PM

## 2018-06-06 NOTE — Tx Team (Addendum)
Interdisciplinary Treatment and Diagnostic Plan Update  06/06/2018 Time of Session: 1030am Chris [name] MRN: [id number]  Principal Diagnosis: Schizoaffective disorder, bipolar type [facility])  Secondary Diagnoses: Principal Problem:   Schizoaffective disorder, bipolar type ([place])   Current Medications:  Current Facility-Administered Medications  Medication Dose Route Frequency Provider Last Rate Last Dose  . acetaminophen (TYLENOL) tablet 650 mg  650 mg Oral Q6H PRN Lavella Hammock, MD      . alum & mag hydroxide-simeth (MAALOX/MYLANTA) 200-200-20 MG/5ML suspension 30 mL  30 mL Oral Q4H PRN Lavella Hammock, MD      . diphenhydrAMINE (BENADRYL) capsule 50 mg  50 mg Oral QHS PRN Lavella Hammock, MD      . divalproex (DEPAKOTE) DR tablet 500 mg  500 mg Oral Q8H Pucilowska, Jolanta B, MD      . hydrOXYzine (ATARAX/VISTARIL) tablet 25 mg  25 mg Oral Q6H PRN Lavella Hammock, MD      . ibuprofen (ADVIL,MOTRIN) tablet 400 mg  400 mg Oral BID PRN Lavella Hammock, MD      . LORazepam (ATIVAN) tablet 1 mg  1 mg Oral Q4H PRN Lavella Hammock, MD      . magnesium hydroxide (MILK OF MAGNESIA) suspension 30 mL  30 mL Oral Daily PRN Lavella Hammock, MD      . risperiDONE (RISPERDAL) tablet 2 mg  2 mg Oral BID Pucilowska, Jolanta B, MD       PTA Medications: Medications Prior to Admission  Medication Sig Dispense Refill Last Dose  . ibuprofen (ADVIL,MOTRIN) 200 MG tablet Take 400 mg by mouth 2 (two) times daily as needed for moderate pain.   Past Week at Unknown time    Patient Stressors: Financial difficulties Loss of Homeless Medication change or noncompliance Substance abuse  Patient Strengths: Ability for insight Average or above average intelligence Capable of independent living  Treatment Modalities: Medication Management, Group therapy, Case management,  1 to 1 session with clinician, Psychoeducation, Recreational therapy.   Physician Treatment Plan for Primary Diagnosis:  Schizoaffective disorder, bipolar type ([place]) Long Term Goal(s):     Short Term Goals:    Medication Management: Evaluate patient's response, side effects, and tolerance of medication regimen.  Therapeutic Interventions: 1 to 1 sessions, Unit Group sessions and Medication administration.  Evaluation of Outcomes: Not Met  Physician Treatment Plan for Secondary Diagnosis: Principal Problem:   Schizoaffective disorder, bipolar type ([place])  Long Term Goal(s):     Short Term Goals:       Medication Management: Evaluate patient's response, side effects, and tolerance of medication regimen.  Therapeutic Interventions: 1 to 1 sessions, Unit Group sessions and Medication administration.  Evaluation of Outcomes: Not Met   RN Treatment Plan for Primary Diagnosis: Schizoaffective disorder, bipolar type ([place]) Long Term Goal(s): Knowledge of disease and therapeutic regimen to maintain health will improve  Short Term Goals: Ability to demonstrate self-control, Ability to participate in decision making will improve, Ability to identify and develop effective coping behaviors will improve and Compliance with prescribed medications will improve  Medication Management: RN will administer medications as ordered by provider, will assess and evaluate patient's response and provide education to patient for prescribed medication. RN will report any adverse and/or side effects to prescribing provider.  Therapeutic Interventions: 1 on 1 counseling sessions, Psychoeducation, Medication administration, Evaluate responses to treatment, Monitor vital signs and CBGs as ordered, Perform/monitor CIWA, COWS, AIMS and Fall Risk screenings as ordered, Perform wound care treatments as ordered.  Evaluation of Outcomes: Not Met   LCSW Treatment Plan for Primary Diagnosis: Schizoaffective disorder, bipolar type ([place]) Long Term Goal(s): Safe transition to appropriate next level of care at discharge, Engage patient in  therapeutic group addressing interpersonal concerns.  Short Term Goals: Engage patient in aftercare planning with referrals and resources  Therapeutic Interventions: Assess for all discharge needs, 1 to 1 time with Social worker, Explore available resources and support systems, Assess for adequacy in community support network, Educate family and significant other(s) on suicide prevention, Complete Psychosocial Assessment, Interpersonal group therapy.  Evaluation of Outcomes: Not Met   Progress in Treatment: Attending groups: Yes. Participating in groups: Yes. Taking medication as prescribed: Yes. Toleration medication: Yes. Family/Significant other contact made: No, will contact:  when pt gives consent Patient understands diagnosis: No. Discussing patient identified problems/goals with staff: Yes. Medical problems stabilized or resolved: No. Denies suicidal/homicidal ideation: Yes. Issues/concerns per patient self-inventory: No. Other: NA  New problem(s) identified: No, Describe:  none reported  New Short Term/Long Term Goal(s): "Get my daughter back in my life. Remain positive, remain humble"  Patient Goals: Get my daughter back in my life. Remain positive, remain humble"  Discharge Plan or Barriers: Pt is homeless, no income, unsure about support system, unsure about legal history. Discharge plan is tbd.  Reason for Continuation of Hospitalization: Medication stabilization  Estimated Length of Stay: 5-7 days  Recreational Therapy: Patient Stressors: N/A Patient Goal: Patient will focus on task/topic with 2 prompts from staff within 5 recreation therapy group sessions  Attendees: Patient:Chris [name] 06/06/2018 2:09 PM  Physician: Orson Slick 06/06/2018 2:09 PM  Nursing: Lyda Kalata 06/06/2018 2:09 PM  RN Care Manager: 06/06/2018 2:09 PM  Social Worker: Sanjuana Kava LCSW Olivia Moton LCSW 06/06/2018 2:09 PM  Recreational Therapist: Roanna Epley LRT 06/06/2018  2:09 PM  Other:  06/06/2018 2:09 PM  Other:  06/06/2018 2:09 PM  Other: 06/06/2018 2:09 PM    Scribe for Treatment Team: Yvette Rack, LCSW 06/06/2018 2:09 PM
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
## 2018-06-06 NOTE — H&P (Signed)
Psychiatric Admission Assessment Adult  Patient Identification: [name] MRN:  [id number] Date of Evaluation:  06/06/2018 Chief Complaint:  Schizoaffective Disorder Principal Diagnosis: Schizoaffective disorder, bipolar type (HCC) Diagnosis:  Principal Problem:   Schizoaffective disorder, bipolar type (HCC)  History of Present Illness:   Identifying data. [name] is a 33 year old male with a history of bipolar disorder.  Chief complaint. "I need to change my environment."  History of prsent illness. Information was obtained from the patient and the chart. The patient was brought to [name] ER from a local bus stop where he was acting bizarrely, including stepping in front of the traffic. He was psychotic, disorganized in his thinking, agitated at times, trying to leave ER, urinating on the floor. He was brought to [name] for treatment and stabilization.  There is little information about the patient. He contributes almost nothing and is an unreliable source. He reports that for the past 3-4 months, he was at [name]. He did not take any medications. One day, they put him on [name] bus and send him back to [name] where he is homeless. But he also tells [place] that he has an ankle bracelet which is not true. He is paranoid and delusional, on a mission to save the world. Religiously preoccupied. He likely hallucinates but denies on direct questioning. He comes to the meeting with copious notes full of goals to accomplish, including coping skills. The patient himself, denais any problems. He has not been using drugs or alcohol. He is positive for benzos only, likely given in the ER.  Past psychiatric history. There are two prior admission at [name]. Diagnosed bipolar in 2014 discharge on Seroquel and Tegretol. In 2017 diagnosed with paranoid schizophrenia discharged on Trileptal and Invega sustenna injection.  Family history. Unknown.  Social history. Homeless and uninsured. He has a daughter  who is in foster care somewhere and is now determined to get custody back. He did not complete high school, no GEDs.   Total Time spent with patient: 1 hour  Is the patient at risk to self? Yes.    Has the patient been a risk to self in the past 6 months? No.  Has the patient been a risk to self within the distant past? No.  Is the patient a risk to others? No.  Has the patient been a risk to others in the past 6 months? No.  Has the patient been a risk to others within the distant past? No.   Prior Inpatient Therapy:   Prior Outpatient Therapy:    Alcohol Screening: 1. How often do you have a drink containing alcohol?: Never 2. How many drinks containing alcohol do you have on a typical day when you are drinking?: 1 or 2 3. How often do you have six or more drinks on one occasion?: Never AUDIT-C Score: 0 9. Have you or someone else been injured as a result of your drinking?: No 10. Has a relative or friend or a doctor or another health worker been concerned about your drinking or suggested you cut down?: No Alcohol Use Disorder Identification Test Final Score (AUDIT): 0 Alcohol Brief Interventions/Follow-up: AUDIT Score <7 follow-up not indicated Substance Abuse History in the last 12 months:  No. Consequences of Substance Abuse: NA Previous Psychotropic Medications: Yes  Psychological Evaluations: No  Past Medical History:  Past Medical History:  Diagnosis Date  . Anxiety   . Bipolar 2 disorder (HCC)   . Depression   . Schizophrenia (HCC)  History reviewed. No pertinent surgical history. Family History:  Family History  Problem Relation Age of Onset  . Bipolar disorder Other   . Schizophrenia Other    Tobacco Screening:   Social History:  Social History   Substance and Sexual Activity  Alcohol Use Yes  . Alcohol/week: 2.0 standard drinks  . Types: 2 Standard drinks or equivalent per week     Social History   Substance and Sexual Activity  Drug Use Yes  .  Types: Marijuana    Additional Social History: Marital status: Single Are you sexually active?: No What is your sexual orientation?: Heterosexual Has your sexual activity been affected by drugs, alcohol, medication, or emotional stress?: None reported Does patient have children?: Yes How many children?: 1 How is patient's relationship with their children?: Pt says he has a daughter age 28 who lives in [name] with her mother.                         Allergies:   Allergies  Allergen Reactions  . Penicillins Hives and Rash    Has patient had a PCN reaction causing immediate rash, facial/tongue/throat swelling, SOB or lightheadedness with hypotension: YES Has patient had a PCN reaction causing severe rash involving mucus membranes or skin necrosis: YES Has patient had a PCN reaction that required hospitalization NO Has patient had a PCN reaction occurring within the last 10 years:NO If all of the above answers are "NO", then may proceed with Cephalosporin use.    Lab Results:  Results for orders placed or performed during the hospital encounter of 06/05/18 (from the past 48 hour(s))  Hemoglobin A1c     Status: None   Collection Time: 06/05/18  5:37 PM  Result Value Ref Range   Hgb A1c MFr Bld 5.6 4.8 - 5.6 %    Comment: (NOTE) Pre diabetes:          5.7%-6.4% Diabetes:              >6.4% Glycemic control for   <7.0% adults with diabetes    Mean Plasma Glucose 114.02 mg/dL    Comment: Performed at [facility], [address], [name] [address]  Lipid panel     Status: Abnormal   Collection Time: 06/05/18  5:37 PM  Result Value Ref Range   Cholesterol 111 0 - 200 mg/dL   Triglycerides 66 <676 mg/dL   HDL 38 (L) >72 mg/dL   Total CHOL/HDL Ratio 2.9 RATIO   VLDL 13 0 - 40 mg/dL   LDL Cholesterol 60 0 - 99 mg/dL    Comment:        Total Cholesterol/HDL:CHD Risk Coronary Heart Disease Risk Table                     Men   Women  1/2 Average Risk    3.4   3.3  Average Risk       5.0   4.4  2 X Average Risk   9.6   7.1  3 X Average Risk  23.4   11.0        Use the calculated Patient Ratio above and the CHD Risk Table to determine the patient's CHD Risk.        ATP III CLASSIFICATION (LDL):  <100     mg/dL   Optimal  094-709  mg/dL   Near or Above  Optimal  130-159  mg/dL   Borderline  468-032  mg/dL   High  >122     mg/dL   Very High Performed at [name], [address] [place], [name] [address]   TSH     Status: None   Collection Time: 06/05/18  5:37 PM  Result Value Ref Range   TSH 1.088 0.350 - 4.500 uIU/mL    Comment: Performed by a 3rd Generation assay with a functional sensitivity of <=0.01 uIU/mL. Performed at [facility], [address] [place], [name] [address]     Blood Alcohol level:  Lab Results  Component Value Date   ETH <10 06/05/2018   ETH 67 (H) 09/26/2017    Metabolic Disorder Labs:  Lab Results  Component Value Date   HGBA1C 5.6 06/05/2018   MPG 114.02 06/05/2018   MPG 94 12/01/2015   Lab Results  Component Value Date   PROLACTIN 58.4 (H) 12/01/2015   Lab Results  Component Value Date   CHOL 111 06/05/2018   TRIG 66 06/05/2018   HDL 38 (L) 06/05/2018   CHOLHDL 2.9 06/05/2018   VLDL 13 06/05/2018   LDLCALC 60 06/05/2018   LDLCALC 50 12/01/2015    Current Medications: Current Facility-Administered Medications  Medication Dose Route Frequency Provider Last Rate Last Dose  . acetaminophen (TYLENOL) tablet 650 mg  650 mg Oral Q6H PRN Mariel Craft, MD      . alum & mag hydroxide-simeth (MAALOX/MYLANTA) 200-200-20 MG/5ML suspension 30 mL  30 mL Oral Q4H PRN Mariel Craft, MD      . diphenhydrAMINE (BENADRYL) capsule 50 mg  50 mg Oral QHS PRN Mariel Craft, MD      . divalproex (DEPAKOTE) DR tablet 500 mg  500 mg Oral Q8H Shelia Magallon B, MD      . hydrOXYzine (ATARAX/VISTARIL) tablet 25 mg  25 mg Oral Q6H PRN Mariel Craft, MD      . ibuprofen (ADVIL,MOTRIN) tablet 400 mg  400 mg Oral BID PRN Mariel Craft, MD      . LORazepam (ATIVAN) tablet 1 mg  1 mg Oral Q4H PRN Mariel Craft, MD      . magnesium hydroxide (MILK OF MAGNESIA) suspension 30 mL  30 mL Oral Daily PRN Mariel Craft, MD      . risperiDONE (RISPERDAL) tablet 2 mg  2 mg Oral BID Tierany Appleby B, MD       PTA Medications: Medications Prior to Admission  Medication Sig Dispense Refill Last Dose  . ibuprofen (ADVIL,MOTRIN) 200 MG tablet Take 400 mg by mouth 2 (two) times daily as needed for moderate pain.   Past Week at Unknown time    Musculoskeletal: Strength & Muscle Tone: within normal limits Gait & Station: normal Patient leans: N/A  Psychiatric Specialty Exam: Physical Exam  Nursing note and vitals reviewed. Constitutional: He appears well-developed and well-nourished.  HENT:  Head: Normocephalic and atraumatic.  Eyes: Pupils are equal, round, and reactive to light. Conjunctivae and EOM are normal.  Neck: Normal range of motion. Neck supple.  Cardiovascular: Normal rate and regular rhythm.  Respiratory: Effort normal and breath sounds normal.  GI: Soft.  Musculoskeletal: Normal range of motion.  Neurological: He is alert.  Skin: Skin is warm and dry.  Psychiatric: His affect is labile and inappropriate. His speech is rapid and/or pressured. He is hyperactive and actively hallucinating. Thought content is paranoid and delusional. Cognition and memory are impaired. He  expresses impulsivity.    Review of Systems  Neurological: Negative.   Psychiatric/Behavioral: Negative.   All other systems reviewed and are negative.   Blood pressure (!) 152/86, pulse 87, temperature 98.6 F (37 C), temperature source Oral, resp. rate 18, height 5' 11.65" (1.82 m), weight 79.4 kg, SpO2 100 %.Body mass index is 23.96 kg/m.  See SRA                                                  Sleep:  Number of Hours:  4.15    Treatment Plan Summary: Daily contact with patient to assess and evaluate symptoms and progress in treatment and Medication management   Mr. Terrilee CroakKnight is a 33 year old male with a history of bipolar disodre admitted for manic episode in the context of treatment noncomplinace.  #Psychosis -give Invaga sustenna 234 mg today -start Risperdal 2 mg BID -Depakote 500 mg TID -Ativan PRN -Restoril 15 mg nightly  #Labs -lipid panel, TSH, A1C -EKG  #Disposition -patient is homeless -lives in [place]   Observation Level/Precautions:  15 minute checks  Laboratory:  CBC Chemistry Profile UDS UA  Psychotherapy:    Medications:    Consultations:    Discharge Concerns:    Estimated LOS:  Other:     Physician Treatment Plan for Primary Diagnosis: Schizoaffective disorder, bipolar type (HCC) Long Term Goal(s): Improvement in symptoms so as ready for discharge  Short Term Goals: Ability to identify changes in lifestyle to reduce recurrence of condition will improve, Ability to verbalize feelings will improve, Ability to disclose and discuss suicidal ideas, Ability to demonstrate self-control will improve, Ability to identify and develop effective coping behaviors will improve, Ability to maintain clinical measurements within normal limits will improve, Compliance with prescribed medications will improve and Ability to identify triggers associated with substance abuse/mental health issues will improve  Physician Treatment Plan for Secondary Diagnosis: Principal Problem:   Schizoaffective disorder, bipolar type (HCC)  Long Term Goal(s): Improvement in symptoms so as ready for discharge  Short Term Goals: NA  I certify that inpatient services furnished can reasonably be expected to improve the patient's condition.    Kristine LineaJolanta Forever Arechiga, MD 3/4/20201:53 PM

## 2018-06-06 NOTE — Progress Notes (Signed)
Patient refused to take pill form medications, but did receive and agree to take invega injection. Patient tolerated injection well.

## 2018-06-06 NOTE — Progress Notes (Signed)
Recreation Therapy Notes  Date: 06/06/2018  Time: 9:30 am  Location: Craft Room  Behavioral response: Disorganized  Intervention Topic: Coping Skills  Discussion/Intervention:  Group content on today was focused on coping skills. The group defined what coping skills are and when they can be used. Individuals described how they normally cope with thing and the coping skills they normally use. Patients expressed why it is important to cope with things and how not coping with things can affect you. The group participated in the intervention "Exploring coping skills" where they had a chance to test new coping skills they could use in the future.  Clinical Observations/Feedback:  Patient came to group and  Stated cleanliness is next to Godliness and to cope with things he likes to close his eyes and relax, remain focus and remain humble. He left group early due to unknown reasons and never returned.  Wei Poplaski LRT/CTRS         Emelda Kohlbeck 06/06/2018 11:28 AM

## 2018-06-06 NOTE — Plan of Care (Addendum)
Patient is alert and oriented X 3, denies SI, HI and AVH. Patient's speech is rapid and incomprehensive at times. Patient can be irritable at times and disorganized in thought. Flight of ideas present. Patient states he has no concerns only wanting to wash clothing stating, " I am trying to be a man and take care of myself and clean my clothes but they didn't let me last night." Patient clothing was placed in laundry room. Patient is disheveled in dress. Patient is not exhibiting any self harming behaviors at this time.  Problem: Education: Goal: Knowledge of [place] [facility] Education information/materials will improve Outcome: Not Progressing   Problem: Coping: Goal: Ability to verbalize frustrations and anger appropriately will improve Outcome: Not Progressing Goal: Ability to demonstrate self-control will improve Outcome: Not Progressing   Problem: Safety: Goal: Periods of time without injury will increase Outcome: Not Progressing   Problem: Education: Goal: Knowledge of [place] [facility] Education information/materials will improve Outcome: Not Progressing Goal: Emotional status will improve Outcome: Not Progressing Goal: Mental status will improve Outcome: Not Progressing Goal: Verbalization of understanding the information provided will improve Outcome: Not Progressing

## 2018-06-06 NOTE — [facility] Suicide Risk Assessment (Signed)
[facility] Admission Suicide Risk Assessment   Nursing information obtained from:  Patient Demographic factors:  Male, Low socioeconomic status, Unemployed Current Mental Status:  Self-harm behaviors Loss Factors:  Decrease in vocational status, Financial problems / change in socioeconomic status Historical Factors:  Impulsivity Risk Reduction Factors:  NA  Total Time spent with patient: 1 hour Principal Problem: <principal problem not specified> Diagnosis:  Active Problems:   Schizoaffective disorder, bipolar type (HCC)  Subjective Data: psychotic break  Continued Clinical Symptoms:  Alcohol Use Disorder Identification Test Final Score (AUDIT): 0 The "Alcohol Use Disorders Identification Test", Guidelines for Use in Primary Care, Second Edition.  World Science writer [facility]). Score between 0-7:  no or low risk or alcohol related problems. Score between 8-15:  moderate risk of alcohol related problems. Score between 16-19:  high risk of alcohol related problems. Score 20 or above:  warrants further diagnostic evaluation for alcohol dependence and treatment.   CLINICAL FACTORS:   Bipolar Disorder:   Mixed State   Musculoskeletal: Strength & Muscle Tone: within normal limits Gait & Station: normal Patient leans: N/A  Psychiatric Specialty Exam: Physical Exam  Nursing note and vitals reviewed. Psychiatric: His affect is labile and inappropriate. His speech is rapid and/or pressured. He is hyperactive and actively hallucinating. Thought content is paranoid and delusional. Cognition and memory are impaired. He expresses impulsivity. He is noncommunicative.    Review of Systems  Neurological: Negative.   Psychiatric/Behavioral: Negative.   All other systems reviewed and are negative.   Blood pressure (!) 152/86, pulse 87, temperature 98.6 F (37 C), temperature source Oral, resp. rate 18, height 5' 11.65" (1.82 m), weight 79.4 kg, SpO2 100 %.Body mass index is 23.96 kg/m.   General Appearance: Casual  Eye Contact:  Good  Speech:  Pressured  Volume:  Increased  Mood:  Euphoric  Affect:  Congruent  Thought Process:  Irrelevant  Orientation:  Full (Time, Place, and Person)  Thought Content:  Delusions, Hallucinations: Auditory and Paranoid Ideation  Suicidal Thoughts:  No  Homicidal Thoughts:  No  Memory:  Immediate;   Poor Recent;   Poor Remote;   Poor  Judgement:  Poor  Insight:  Lacking  Psychomotor Activity:  Increased  Concentration:  Concentration: Poor and Attention Span: Poor  Recall:  Poor  Fund of Knowledge:  Poor  Language:  Poor  Akathisia:  No  Handed:  Right  AIMS (if indicated):     Assets:  Communication Skills Desire for Improvement Physical Health Resilience  ADL's:  Intact  Cognition:  WNL  Sleep:  Number of Hours: 4.15      COGNITIVE FEATURES THAT CONTRIBUTE TO RISK:  None    SUICIDE RISK:   Minimal: No identifiable suicidal ideation.  Patients presenting with no risk factors but with morbid ruminations; may be classified as minimal risk based on the severity of the depressive symptoms  PLAN OF CARE: hospital admission, medication management, discharge planning.  [name] is a 33 year old male with a history of bipolar disodre admitted for manic episode in the context of treatment noncomplinace.  #Psychosis -start Risperdal 2 mg BID -Depakote 500 mg TID -Ativan PRN -Restoril 15 mg nightly  #Labs -lipid panel, TSH, A1C -EKG  #Disposition -patient is homeless -lives in [place]     I certify that inpatient services furnished can reasonably be expected to improve the patient's condition.   Kristine Linea, MD 06/06/2018, 1:39 PM

## 2018-06-07 MED ORDER — TEMAZEPAM 15 MG PO CAPS: 15.0000 mg | ORAL_CAPSULE | Freq: Every day | ORAL | Status: DC

## 2018-06-07 NOTE — Progress Notes (Signed)
D - Patient was in his room upon arrival to the unit. Patient was pleasant during assessment and medication administration. Patient denies SI/HI/AVH, pain, anxiety and depression. Patient was observed pacing the unit but was redirectable.   A - Patient was compliant with medication administration per MD orders and procedures on the unit. Patient given education. Patient given support and encouragement to be active in his treatment plan. Patient informed to let staff know if there are any issues or problems on the unit.   R - Patient being monitored Q 15 minutes for safety per unit protocol. Patient remains safe on the unit.

## 2018-06-07 NOTE — Plan of Care (Signed)
Patient is very disorganized in thought and has flight of ideas when talking. Speech is rapid and incoherent at times. Patient is pleasant but continues to not take medications in pill form, no self injurious behaviors noted. Patient is a little more animated today then yesterday. Safety checks to continue.

## 2018-06-07 NOTE — Progress Notes (Signed)
Recreation Therapy Notes  Date: 06/07/2018  Time: 9:30 am  Location: Craft Room  Behavioral response: Off-topic  Intervention Topic: Relaxation   Discussion/Intervention:  Group content today was focused on relaxation. The group defined relaxation and identified healthy ways to relax. Individuals expressed how much time they spend relaxing. Patients expressed how much their life would be if they did not make time for themselves to relax. The group stated ways they could improve their relaxation techniques in the future.  Individuals participated in the intervention "Time to Relax" where they had a chance to experience different relaxation techniques.  Clinical Observations/Feedback:  Patient came to group and shared that he made his daughter something for her upcoming birthday and he is working on getting custody. He was focused on what peers and staff had to say about relaxation.  [name] LRT/CTRS         Keileigh Vahey 06/07/2018 10:59 AM

## 2018-06-07 NOTE — Progress Notes (Signed)
Recreation Therapy Notes  INPATIENT RECREATION THERAPY ASSESSMENT  Patient Details Name: Deforrest Hickingbottom MRN: [id number] DOB: 03-07-1986 Today's Date: 06/07/2018       Information Obtained From: Patient  Able to Participate in Assessment/Interview: Yes  Patient Presentation: Responsive, Confused, Hyperverbal  Reason for Admission (Per Patient): Active Symptoms  Patient Stressors:    Coping Skills:   Talk, Prayer  Leisure Interests (2+):  Exercise - Paediatric nurse, Exercise - Running, Exercise - Walking, Individual - Writing  Frequency of Recreation/Participation:    Awareness of Community Resources:     [employer]:     Current Use:    If no, Barriers?:    Expressed Interest in [employer] Information:    [place] of Residence:  [place]  Patient Main Form of Transportation: Therapist, music  Patient Strengths:  caring  Patient Identified Areas of Improvement:  getting mt daughter back  Patient Goal for Hospitalization:  To get my daughter back in my life and remain humble and positive.  Current SI (including self-harm):  No  Current HI:  No  Current AVH: No  Staff Intervention Plan: Group Attendance, Collaborate with Interdisciplinary Treatment Team  Consent to Intern Participation: N/A  Escher Harr 06/07/2018, 2:13 PM

## 2018-06-07 NOTE — Progress Notes (Signed)
[facility] MD Progress Note  06/07/2018 10:01 AM Chris [name]  MRN:  [id number]  Subjective:    Chris [name] declined Depakote but accepted Invega sustenna injection. He also refuses Risperdal claiming allergy. He is still paranoid, delusional, disorganized and religiously preoccupied. Next injection of Sustenna on Sunday.  Disposition remains a problem as the patient has not been able to provide any information on his living situation. Apparently homeless.  Principal Problem: Schizoaffective disorder, bipolar type (HCC) Diagnosis: Principal Problem:   Schizoaffective disorder, bipolar type (HCC)  Total Time spent with patient: 20 minutes  Past Psychiatric History: bipolar disorder  Past Medical History:  Past Medical History:  Diagnosis Date  . Anxiety   . Bipolar 2 disorder (HCC)   . Depression   . Schizophrenia (HCC)    History reviewed. No pertinent surgical history. Family History:  Family History  Problem Relation Age of Onset  . Bipolar disorder Other   . Schizophrenia Other    Family Psychiatric  History: unknown Social History:  Social History   Substance and Sexual Activity  Alcohol Use Yes  . Alcohol/week: 2.0 standard drinks  . Types: 2 Standard drinks or equivalent per week     Social History   Substance and Sexual Activity  Drug Use Yes  . Types: Marijuana    Social History   Socioeconomic History  . Marital status: Single    Spouse name: Not on file  . Number of children: Not on file  . Years of education: Not on file  . Highest education level: Not on file  Occupational History  . Not on file  Social Needs  . Financial resource strain: Not on file  . Food insecurity:    Worry: Not on file    Inability: Not on file  . Transportation needs:    Medical: Not on file    Non-medical: Not on file  Tobacco Use  . Smoking status: Current Every Day Smoker    Packs/day: 0.50  . Smokeless tobacco: Current User  Substance and Sexual Activity  . Alcohol  use: Yes    Alcohol/week: 2.0 standard drinks    Types: 2 Standard drinks or equivalent per week  . Drug use: Yes    Types: Marijuana  . Sexual activity: Yes  Lifestyle  . Physical activity:    Days per week: Not on file    Minutes per session: Not on file  . Stress: Not on file  Relationships  . Social connections:    Talks on phone: Not on file    Gets together: Not on file    Attends religious service: Not on file    Active member of club or organization: Not on file    Attends meetings of clubs or organizations: Not on file    Relationship status: Not on file  Other Topics Concern  . Not on file  Social History Narrative  . Not on file   Additional Social History:                         Sleep: Fair  Appetite:  Fair  Current Medications: Current Facility-Administered Medications  Medication Dose Route Frequency Provider Last Rate Last Dose  . acetaminophen (TYLENOL) tablet 650 mg  650 mg Oral Q6H PRN Mariel Craft, MD      . alum & mag hydroxide-simeth (MAALOX/MYLANTA) 200-200-20 MG/5ML suspension 30 mL  30 mL Oral Q4H PRN Mariel Craft, MD      .  diphenhydrAMINE (BENADRYL) capsule 50 mg  50 mg Oral QHS PRN Mariel Craft, MD   50 mg at 06/07/18 0813  . divalproex (DEPAKOTE) DR tablet 500 mg  500 mg Oral Q8H Vishnu Moeller B, MD   500 mg at 06/06/18 2317  . hydrOXYzine (ATARAX/VISTARIL) tablet 25 mg  25 mg Oral Q6H PRN Mariel Craft, MD      . ibuprofen (ADVIL,MOTRIN) tablet 400 mg  400 mg Oral BID PRN Mariel Craft, MD      . LORazepam (ATIVAN) tablet 1 mg  1 mg Oral Q4H PRN Mariel Craft, MD   1 mg at 06/06/18 2318  . magnesium hydroxide (MILK OF MAGNESIA) suspension 30 mL  30 mL Oral Daily PRN Mariel Craft, MD      . Melene Muller ON 06/10/2018] paliperidone (INVEGA SUSTENNA) injection 156 mg  156 mg Intramuscular Once Brooklyn Alfredo B, MD      . paliperidone (INVEGA SUSTENNA) injection 234 mg  234 mg Intramuscular Q28 days Raiyah Speakman,  Sharma Lawrance B, MD   234 mg at 06/06/18 1601  . risperiDONE (RISPERDAL) tablet 2 mg  2 mg Oral BID Ryne Mctigue B, MD        Lab Results:  Results for orders placed or performed during the hospital encounter of 06/05/18 (from the past 48 hour(s))  Hemoglobin A1c     Status: None   Collection Time: 06/05/18  5:37 PM  Result Value Ref Range   Hgb A1c MFr Bld 5.6 4.8 - 5.6 %    Comment: (NOTE) Pre diabetes:          5.7%-6.4% Diabetes:              >6.4% Glycemic control for   <7.0% adults with diabetes    Mean Plasma Glucose 114.02 mg/dL    Comment: Performed at [facility], [address], [place], [place] [address]  Lipid panel     Status: Abnormal   Collection Time: 06/05/18  5:37 PM  Result Value Ref Range   Cholesterol 111 0 - 200 mg/dL   Triglycerides 66 <696 mg/dL   HDL 38 (L) >29 mg/dL   Total CHOL/HDL Ratio 2.9 RATIO   VLDL 13 0 - 40 mg/dL   LDL Cholesterol 60 0 - 99 mg/dL    Comment:        Total Cholesterol/HDL:CHD Risk Coronary Heart Disease Risk Table                     Men   Women  1/2 Average Risk   3.4   3.3  Average Risk       5.0   4.4  2 X Average Risk   9.6   7.1  3 X Average Risk  23.4   11.0        Use the calculated Patient Ratio above and the CHD Risk Table to determine the patient's CHD Risk.        ATP III CLASSIFICATION (LDL):  <100     mg/dL   Optimal  528-413  mg/dL   Near or Above                    Optimal  130-159  mg/dL   Borderline  244-010  mg/dL   High  >272     mg/dL   Very High Performed at [facility], [address], [place], [place] [address]   TSH  Status: None   Collection Time: 06/05/18  5:37 PM  Result Value Ref Range   TSH 1.088 0.350 - 4.500 uIU/mL    Comment: Performed by a 3rd Generation assay with a functional sensitivity of <=0.01 uIU/mL. Performed at [facility], [address] [place], [place] [address]     Blood Alcohol level:  Lab Results  Component Value Date   ETH  <10 06/05/2018   ETH 67 (H) 09/26/2017    Metabolic Disorder Labs: Lab Results  Component Value Date   HGBA1C 5.6 06/05/2018   MPG 114.02 06/05/2018   MPG 94 12/01/2015   Lab Results  Component Value Date   PROLACTIN 58.4 (H) 12/01/2015   Lab Results  Component Value Date   CHOL 111 06/05/2018   TRIG 66 06/05/2018   HDL 38 (L) 06/05/2018   CHOLHDL 2.9 06/05/2018   VLDL 13 06/05/2018   LDLCALC 60 06/05/2018   LDLCALC 50 12/01/2015    Physical Findings: AIMS: Facial and Oral Movements Muscles of Facial Expression: None, normal Lips and Perioral Area: None, normal Jaw: None, normal Tongue: None, normal,Extremity Movements Upper (arms, wrists, hands, fingers): None, normal Lower (legs, knees, ankles, toes): None, normal, Trunk Movements Neck, shoulders, hips: None, normal, Overall Severity Severity of abnormal movements (highest score from questions above): None, normal Incapacitation due to abnormal movements: None, normal Patient's awareness of abnormal movements (rate only patient's report): No Awareness, Dental Status Current problems with teeth and/or dentures?: No Does patient usually wear dentures?: No  CIWA:    COWS:     Musculoskeletal: Strength & Muscle Tone: within normal limits Gait & Station: normal Patient leans: N/A  Psychiatric Specialty Exam: Physical Exam  Nursing note and vitals reviewed. Psychiatric: His affect is labile and inappropriate. His speech is rapid and/or pressured. He is hyperactive. Thought content is paranoid and delusional. Cognition and memory are impaired. He expresses impulsivity.    Review of Systems  Neurological: Negative.   Psychiatric/Behavioral: Negative.   All other systems reviewed and are negative.   Blood pressure 136/88, pulse 97, temperature 98.1 F (36.7 C), temperature source Oral, resp. rate 18, height 5' 11.65" (1.82 m), weight 79.4 kg, SpO2 100 %.Body mass index is 23.96 kg/m.  General Appearance: Casual   Eye Contact:  Good  Speech:  Pressured  Volume:  Increased  Mood:  Euphoric  Affect:  Congruent  Thought Process:  Irrelevant  Orientation:  Full (Time, Place, and Person)  Thought Content:  Delusions and Paranoid Ideation  Suicidal Thoughts:  No  Homicidal Thoughts:  No  Memory:  Immediate;   Poor Recent;   Poor Remote;   Poor  Judgement:  Poor  Insight:  Lacking  Psychomotor Activity:  Increased  Concentration:  Concentration: Poor and Attention Span: Poor  Recall:  Poor  Fund of Knowledge:  Poor  Language:  Poor  Akathisia:  No  Handed:  Right  AIMS (if indicated):     Assets:  Communication Skills Desire for Improvement Physical Health Resilience  ADL's:  Intact  Cognition:  WNL  Sleep:  Number of Hours: 5     Treatment Plan Summary: Daily contact with patient to assess and evaluate symptoms and progress in treatment and Medication management   Mr. Gerhard is a 33 year old male with a history of bipolar disodre admitted for manic episode in the context of treatment noncomplinace.  #Psychosis -accapted Invaga sustenna 234 mg on 06/06/2018, next injection 07/04/2018 -discontinue oral Risperdal, patient refuse -discontinue Depakote, patient refuses -stop  Ativan PRN  #Insomnia -Restoril 15 mg nightly  #Labs -lipid panel, TSH, A1C are normal -EKG, NSR with QTc 457  #Disposition -patient is homeless -lives in [place], MD 06/07/2018, 10:01 AM

## 2018-06-07 NOTE — Plan of Care (Signed)
Patient was compliant with medication administration per MD orders.   Problem: Medication: Goal: Compliance with prescribed medication regimen will improve Outcome: Progressing   

## 2018-06-07 NOTE — [facility] Group Notes (Signed)
LCSW Group Therapy Note  06/07/2018 1:00 PM  Type of Therapy/Topic:  Group Therapy:  Balance in Life  Participation Level:  Minimal  Description of Group:    This group will address the concept of balance and how it feels and looks when one is unbalanced. Patients will be encouraged to process areas in their lives that are out of balance and identify reasons for remaining unbalanced. Facilitators will guide patients in utilizing problem-solving interventions to address and correct the stressor making their life unbalanced. Understanding and applying boundaries will be explored and addressed for obtaining and maintaining a balanced life. Patients will be encouraged to explore ways to assertively make their unbalanced needs known to significant others in their lives, using other group members and facilitator for support and feedback.  Therapeutic Goals: 1. Patient will identify two or more emotions or situations they have that consume much of in their lives. 2. Patient will identify signs/triggers that life has become out of balance:  3. Patient will identify two ways to set boundaries in order to achieve balance in their lives:  4. Patient will demonstrate ability to communicate their needs through discussion and/or role plays  Summary of Patient Progress: Patient was present in group, however displayed disorganized and unclear thinking.  Patient spoke only to state that CSW has misspelled his name and that his name ended with an "ly" and to inform the other group members, who were all male that he "did not trust them and they needed to stop stealing my tray".  CSW attempted to let him know that trays are put aside if the patient does not eat at that moment, however, the patient did not seem receptive.  Patient left group and returned moments before group ended.    Therapeutic Modalities:   Cognitive Behavioral Therapy Solution-Focused Therapy Assertiveness Training  Penni Homans MSW,  LCSW 06/07/2018 2:17 PM

## 2018-06-08 MED ORDER — TEMAZEPAM 15 MG PO CAPS
30.0000 mg | ORAL_CAPSULE | Freq: Every day | ORAL | Status: DC
  Administered 2018-06-12 – 2018-06-14 (×2): 30 mg via ORAL
  Filled 2018-06-08 (×2): qty 2

## 2018-06-08 NOTE — Progress Notes (Signed)
[facility] MD Progress Note  06/08/2018 2:35 PM Jeron Grahn  MRN:  [id number]  Subjective:    Mr. Longmore is more subdued today but still completely psychotic, almost word salad. Unable to provide any reliable data. Still refuses oral medications claiming thet they all made his tongue stick out. Apparently it was a judge to send him to [facility]. We will have to look into his criminal record. He was in prison 2014-2016 where he refused mental health. When in [place], he had a case worker Verlee [name], who he thinks was helpful.   He has no support from his family. He is preoccupied with his daughter, who has been in foster care since birth, and is about 4-year-old now. He never saw the baby but did take paternity test and did not signe of parental rights.  Principal Problem: Schizoaffective disorder, bipolar type (HCC) Diagnosis: Principal Problem:   Schizoaffective disorder, bipolar type (HCC)  Total Time spent with patient: 20 minutes  Past Psychiatric History: bipolar disorder  Past Medical History:  Past Medical History:  Diagnosis Date  . Anxiety   . Bipolar 2 disorder (HCC)   . Depression   . Schizophrenia (HCC)    History reviewed. No pertinent surgical history. Family History:  Family History  Problem Relation Age of Onset  . Bipolar disorder Other   . Schizophrenia Other    Family Psychiatric  History: none reported Social History:  Social History   Substance and Sexual Activity  Alcohol Use Yes  . Alcohol/week: 2.0 standard drinks  . Types: 2 Standard drinks or equivalent per week     Social History   Substance and Sexual Activity  Drug Use Yes  . Types: Marijuana    Social History   Socioeconomic History  . Marital status: Single    Spouse name: Not on file  . Number of children: Not on file  . Years of education: Not on file  . Highest education level: Not on file  Occupational History  . Not on file  Social Needs  . Financial resource strain: Not on  file  . Food insecurity:    Worry: Not on file    Inability: Not on file  . Transportation needs:    Medical: Not on file    Non-medical: Not on file  Tobacco Use  . Smoking status: Current Every Day Smoker    Packs/day: 0.50  . Smokeless tobacco: Current User  Substance and Sexual Activity  . Alcohol use: Yes    Alcohol/week: 2.0 standard drinks    Types: 2 Standard drinks or equivalent per week  . Drug use: Yes    Types: Marijuana  . Sexual activity: Yes  Lifestyle  . Physical activity:    Days per week: Not on file    Minutes per session: Not on file  . Stress: Not on file  Relationships  . Social connections:    Talks on phone: Not on file    Gets together: Not on file    Attends religious service: Not on file    Active member of club or organization: Not on file    Attends meetings of clubs or organizations: Not on file    Relationship status: Not on file  Other Topics Concern  . Not on file  Social History Narrative  . Not on file   Additional Social History:  Sleep: Fair  Appetite:  Fair  Current Medications: Current Facility-Administered Medications  Medication Dose Route Frequency Provider Last Rate Last Dose  . acetaminophen (TYLENOL) tablet 650 mg  650 mg Oral Q6H PRN Mariel Craft, MD      . alum & mag hydroxide-simeth (MAALOX/MYLANTA) 200-200-20 MG/5ML suspension 30 mL  30 mL Oral Q4H PRN Mariel Craft, MD      . diphenhydrAMINE (BENADRYL) capsule 50 mg  50 mg Oral QHS PRN Mariel Craft, MD   50 mg at 06/07/18 0813  . hydrOXYzine (ATARAX/VISTARIL) tablet 25 mg  25 mg Oral Q6H PRN Mariel Craft, MD      . ibuprofen (ADVIL,MOTRIN) tablet 400 mg  400 mg Oral BID PRN Mariel Craft, MD      . magnesium hydroxide (MILK OF MAGNESIA) suspension 30 mL  30 mL Oral Daily PRN Mariel Craft, MD      . Melene Muller ON 06/10/2018] paliperidone (INVEGA SUSTENNA) injection 156 mg  156 mg Intramuscular Once ,   B, MD      . paliperidone (INVEGA SUSTENNA) injection 234 mg  234 mg Intramuscular Q28 days ,  B, MD   234 mg at 06/06/18 1601  . temazepam (RESTORIL) capsule 15 mg  15 mg Oral QHS ,  B, MD        Lab Results: No results found for this or any previous visit (from the past 48 hour(s)).  Blood Alcohol level:  Lab Results  Component Value Date   ETH <10 06/05/2018   ETH 67 (H) 09/26/2017    Metabolic Disorder Labs: Lab Results  Component Value Date   HGBA1C 5.6 06/05/2018   MPG 114.02 06/05/2018   MPG 94 12/01/2015   Lab Results  Component Value Date   PROLACTIN 58.4 (H) 12/01/2015   Lab Results  Component Value Date   CHOL 111 06/05/2018   TRIG 66 06/05/2018   HDL 38 (L) 06/05/2018   CHOLHDL 2.9 06/05/2018   VLDL 13 06/05/2018   LDLCALC 60 06/05/2018   LDLCALC 50 12/01/2015    Physical Findings: AIMS: Facial and Oral Movements Muscles of Facial Expression: None, normal Lips and Perioral Area: None, normal Jaw: None, normal Tongue: None, normal,Extremity Movements Upper (arms, wrists, hands, fingers): None, normal Lower (legs, knees, ankles, toes): None, normal, Trunk Movements Neck, shoulders, hips: None, normal, Overall Severity Severity of abnormal movements (highest score from questions above): None, normal Incapacitation due to abnormal movements: None, normal Patient's awareness of abnormal movements (rate only patient's report): No Awareness, Dental Status Current problems with teeth and/or dentures?: No Does patient usually wear dentures?: No  CIWA:    COWS:     Musculoskeletal: Strength & Muscle Tone: within normal limits Gait & Station: normal Patient leans: N/A  Psychiatric Specialty Exam: Physical Exam  Nursing note and vitals reviewed. Psychiatric: His speech is normal and behavior is normal. His affect is labile. Thought content is paranoid and delusional. Cognition and memory are impaired. He expresses  impulsivity.    Review of Systems  Neurological: Negative.   Psychiatric/Behavioral: Negative.   All other systems reviewed and are negative.   Blood pressure (!) 141/76, pulse 90, temperature 98.8 F (37.1 C), temperature source Oral, resp. rate 18, height 5' 11.65" (1.82 m), weight 79.4 kg, SpO2 100 %.Body mass index is 23.96 kg/m.  General Appearance: Casual  Eye Contact:  Good  Speech:  Slow  Volume:  Decreased  Mood:  Euphoric  Affect:  Congruent  Thought  Process:  Disorganized and Irrelevant  Orientation:  Full (Time, Place, and Person)  Thought Content:  Delusions and Paranoid Ideation  Suicidal Thoughts:  No  Homicidal Thoughts:  No  Memory:  Immediate;   Poor Recent;   Poor Remote;   Poor  Judgement:  Poor  Insight:  Lacking  Psychomotor Activity:  Decreased  Concentration:  Concentration: Poor and Attention Span: Poor  Recall:  Poor  Fund of Knowledge:  Poor  Language:  Poor  Akathisia:  No  Handed:  Right  AIMS (if indicated):     Assets:  Communication Skills Desire for Improvement Physical Health Resilience  ADL's:  Intact  Cognition:  WNL  Sleep:  Number of Hours: 5     Treatment Plan Summary: Daily contact with patient to assess and evaluate symptoms and progress in treatment and Medication management   Mr. Hellenbrand is a 33 year old male with a history of bipolar disodre admitted for manic episode in the context of treatment noncomplinace.  #Psychosis -continue Invaga sustenna 234 mg every 28 days, first injection on 06/06/2018, next dose on 07/04/2018  #Insomnia, slept 5 hours only -increase Restoril to 30 mg nightly  #Labs -lipid panel, TSH, A1C are normal -EKG, NSR with QTc 457  #Disposition -patient is homeless -lives in [place], MD 06/08/2018, 2:35 PM

## 2018-06-08 NOTE — Plan of Care (Signed)
Patient was not pacing around the unit today but was still non compliant with medication administration.   Problem: Education: Goal: Emotional status will improve Outcome: Not Progressing Goal: Mental status will improve Outcome: Not Progressing

## 2018-06-08 NOTE — Progress Notes (Signed)
Recreation Therapy Notes   Date: 06/08/2018  Time: 9:30 am  Location: Craft Room  Behavioral response: Appropriate  Intervention Topic: Leisure  Discussion/Intervention:  Group content today was focused on leisure. The group defined what leisure is and some positive leisure activities they participate in. Individuals identified the difference between good and bad leisure. Participants expressed how they feel after participating in the leisure of their choice. The group discussed how they go about picking a leisure activity and if others are involved in their leisure activities. The patient stated how many leisure activities they too choose from and reasons why it is important to have leisure time. Individuals participated in the intervention "Leisure Jeopardy" where they had a chance to identify new leisure activities as well as benefits of leisure. Clinical Observations/Feedback:  Patient came to group and defined leisure as relaxing, being humble, having patience as well as caring and sharing. Individual was social with peers and staff while participating in group.  Chris [name] Age LRT/CTRS          Chris [name] 06/08/2018 11:57 AM

## 2018-06-08 NOTE — Progress Notes (Signed)
[facility] MD Progress Note  06/09/2018 3:38 PM Chris [name]  MRN:  [id number]  Subjective:    Patient has a court date for larceny on March 23 but he imagines tat he would be going back to [facility] program and that hospitalization counts towards that. It took a while to communicate this to the patient. He now believes that charges are in [facility] and that his lawyer is Larena Sox from [place], [place]. He later was talking about it in group. Othewise, completely confused, coming with multiple list and focus on his relationship with a child.  Principal Problem: Schizoaffective disorder, bipolar type (HCC) Diagnosis: Principal Problem:   Schizoaffective disorder, bipolar type (HCC)  Total Time spent with patient: 20 minutes  Past Psychiatric History: bipolar disorder  Past Medical History:  Past Medical History:  Diagnosis Date  . Anxiety   . Bipolar 2 disorder (HCC)   . Depression   . Schizophrenia (HCC)    History reviewed. No pertinent surgical history. Family History:  Family History  Problem Relation Age of Onset  . Bipolar disorder Other   . Schizophrenia Other    Family Psychiatric  History: none reported Social History:  Social History   Substance and Sexual Activity  Alcohol Use Yes  . Alcohol/week: 2.0 standard drinks  . Types: 2 Standard drinks or equivalent per week     Social History   Substance and Sexual Activity  Drug Use Yes  . Types: Marijuana    Social History   Socioeconomic History  . Marital status: Single    Spouse name: Not on file  . Number of children: Not on file  . Years of education: Not on file  . Highest education level: Not on file  Occupational History  . Not on file  Social Needs  . Financial resource strain: Not on file  . Food insecurity:    Worry: Not on file    Inability: Not on file  . Transportation needs:    Medical: Not on file    Non-medical: Not on file  Tobacco Use  . Smoking status: Current Every Day Smoker   Packs/day: 0.50  . Smokeless tobacco: Current User  Substance and Sexual Activity  . Alcohol use: Yes    Alcohol/week: 2.0 standard drinks    Types: 2 Standard drinks or equivalent per week  . Drug use: Yes    Types: Marijuana  . Sexual activity: Yes  Lifestyle  . Physical activity:    Days per week: Not on file    Minutes per session: Not on file  . Stress: Not on file  Relationships  . Social connections:    Talks on phone: Not on file    Gets together: Not on file    Attends religious service: Not on file    Active member of club or organization: Not on file    Attends meetings of clubs or organizations: Not on file    Relationship status: Not on file  Other Topics Concern  . Not on file  Social History Narrative  . Not on file   Additional Social History:                         Sleep: Fair  Appetite:  Fair  Current Medications: Current Facility-Administered Medications  Medication Dose Route Frequency Provider Last Rate Last Dose  . acetaminophen (TYLENOL) tablet 650 mg  650 mg Oral Q6H PRN Mariel Craft, MD      .  alum & mag hydroxide-simeth (MAALOX/MYLANTA) 200-200-20 MG/5ML suspension 30 mL  30 mL Oral Q4H PRN Mariel Craft, MD      . diphenhydrAMINE (BENADRYL) capsule 50 mg  50 mg Oral QHS PRN Mariel Craft, MD   50 mg at 06/07/18 0813  . hydrOXYzine (ATARAX/VISTARIL) tablet 25 mg  25 mg Oral Q6H PRN Mariel Craft, MD      . ibuprofen (ADVIL,MOTRIN) tablet 400 mg  400 mg Oral BID PRN Mariel Craft, MD      . magnesium hydroxide (MILK OF MAGNESIA) suspension 30 mL  30 mL Oral Daily PRN Mariel Craft, MD      . Melene Muller ON 06/10/2018] paliperidone (INVEGA SUSTENNA) injection 156 mg  156 mg Intramuscular Once Areyana Leoni B, MD      . paliperidone (INVEGA SUSTENNA) injection 234 mg  234 mg Intramuscular Q28 days Shaima Sardinas B, MD   234 mg at 06/06/18 1601  . temazepam (RESTORIL) capsule 30 mg  30 mg Oral QHS Bryson Gavia  B, MD        Lab Results: No results found for this or any previous visit (from the past 48 hour(s)).  Blood Alcohol level:  Lab Results  Component Value Date   ETH <10 06/05/2018   ETH 67 (H) 09/26/2017    Metabolic Disorder Labs: Lab Results  Component Value Date   HGBA1C 5.6 06/05/2018   MPG 114.02 06/05/2018   MPG 94 12/01/2015   Lab Results  Component Value Date   PROLACTIN 58.4 (H) 12/01/2015   Lab Results  Component Value Date   CHOL 111 06/05/2018   TRIG 66 06/05/2018   HDL 38 (L) 06/05/2018   CHOLHDL 2.9 06/05/2018   VLDL 13 06/05/2018   LDLCALC 60 06/05/2018   LDLCALC 50 12/01/2015    Physical Findings: AIMS: Facial and Oral Movements Muscles of Facial Expression: None, normal Lips and Perioral Area: None, normal Jaw: None, normal Tongue: None, normal,Extremity Movements Upper (arms, wrists, hands, fingers): None, normal Lower (legs, knees, ankles, toes): None, normal, Trunk Movements Neck, shoulders, hips: None, normal, Overall Severity Severity of abnormal movements (highest score from questions above): None, normal Incapacitation due to abnormal movements: None, normal Patient's awareness of abnormal movements (rate only patient's report): No Awareness, Dental Status Current problems with teeth and/or dentures?: No Does patient usually wear dentures?: No  CIWA:    COWS:     Musculoskeletal: Strength & Muscle Tone: within normal limits Gait & Station: normal Patient leans: N/A  Psychiatric Specialty Exam: Physical Exam  Nursing note and vitals reviewed. Psychiatric: His speech is normal. His affect is labile and inappropriate. He is withdrawn. Thought content is paranoid and delusional. Cognition and memory are impaired. He expresses impulsivity.    Review of Systems  Neurological: Negative.   Psychiatric/Behavioral: Negative.   All other systems reviewed and are negative.   Blood pressure 123/76, pulse 93, temperature 98.5 F (36.9 C),  temperature source Oral, resp. rate 18, height 5' 11.65" (1.82 m), weight 79.4 kg, SpO2 98 %.Body mass index is 23.96 kg/m.  General Appearance: Casual  Eye Contact:  Good  Speech:  Normal Rate  Volume:  Decreased  Mood:  Euphoric  Affect:  Labile  Thought Process:  Irrelevant  Orientation:  Full (Time, Place, and Person)  Thought Content:  Delusions, Obsessions, Paranoid Ideation, Rumination and Tangential  Suicidal Thoughts:  No  Homicidal Thoughts:  No  Memory:  Immediate;   Poor Recent;   Poor Remote;  Poor  Judgement:  Poor  Insight:  Lacking  Psychomotor Activity:  Increased  Concentration:  Concentration: Poor and Attention Span: Poor  Recall:  Poor  Fund of Knowledge:  Poor  Language:  Poor  Akathisia:  No  Handed:  Right  AIMS (if indicated):     Assets:  Communication Skills Desire for Improvement Physical Health Resilience  ADL's:  Intact  Cognition:  WNL  Sleep:  Number of Hours: 5.25     Treatment Plan Summary: Daily contact with patient to assess and evaluate symptoms and progress in treatment and Medication management   Mr. Terrilee CroakKnight is a 33 year old male with a history of bipolar disoder admitted for amanic episode in the context of treatment noncomplinace.  #Psychosis -continue Invaga sustenna 234 mgevery 28 days, first injection on 06/06/2018, next dose on 07/04/2018  #Insomnia, slept 5 hours only -increase Restoril to 30 mg nightly  #Labs -lipid panel, TSH, A1Care normal -EKG, NSR with QTc 457  #Social -legal charges pending  #Disposition -patient is homeless -[place] in [place]  Kadija Cruzen, MD 06/09/2018, 3:38 PM

## 2018-06-08 NOTE — Plan of Care (Signed)
D: Patient has been isolative to self. Pacing the unit. Refused HS sleeping pill. Denies SI, HI and AV hallucinations but appears to be internally preoccupied. Affect is flat. Mood pleasant. Voices no complaints. A: Continue to monitor for safety and offer support. R: safety maintained

## 2018-06-08 NOTE — Plan of Care (Signed)
Patient is alert to self and place. Patient continues to have some disorganized thoughts, along with flight of ideas. Patient speech is a little more comprehensible. Patient is attending groups and participating. Patient denies SI, HI and AVH. Patient is not aggressive or having any self harming behaviors Problem: Education: Goal: Knowledge of [place] [facility] Education information/materials will improve Outcome: Progressing   Problem: Coping: Goal: Ability to verbalize frustrations and anger appropriately will improve Outcome: Progressing Goal: Ability to demonstrate self-control will improve Outcome: Progressing   Problem: Safety: Goal: Periods of time without injury will increase Outcome: Progressing   Problem: Education: Goal: Knowledge of [place] [facility] Education information/materials will improve Outcome: Progressing Goal: Emotional status will improve Outcome: Progressing Goal: Mental status will improve Outcome: Progressing Goal: Verbalization of understanding the information provided will improve Outcome: Progressing  .

## 2018-06-08 NOTE — Progress Notes (Signed)
D: Patient has been isolative to self. Pacing the unit. Refused HS sleeping pill. Denies SI, HI and AV hallucinations but appears to be internally preoccupied. Affect is flat. Mood pleasant. Voices no complaints. A: Continue to monitor for safety and offer support. R: safety maintained 

## 2018-06-08 NOTE — Progress Notes (Signed)
D - Patient was in the dayroom upon arrival to the unit. Patient was pleasant during assessment. Patient denies SI/HI/AVH, pain, anxiety and depression with this Clinical research associate. Patient was observed being calm and polite to staff and peers on the unit.   A - Patient refused taking his evening medications. See MAR. Patient given education. Patient given support and encouragement to be active in his treatment plan. Patient informed to let staff know if there are any issues or problems on the unit.   R - Patient being monitored Q 15 minutes for safety per unit protocol. Patient remains safe on the unit.

## 2018-06-09 NOTE — Progress Notes (Signed)
D: Patient has been pleasant and cooperative on the unit. Denies SI, HI and AV hallucinations. Has been writing in his journal. Has written a letter to his mother that he was asked to write by his physician. Refusing oral medication. Voicing no complaints. Mood is sad. Affect is blunted. A: Continue to monitor and offer support. R: Safety maintained

## 2018-06-09 NOTE — Plan of Care (Signed)
Patient present in the milieu with steady gait. Denies having SI/HI/AVH and pain at this time. Reports that he slept fair without the use of a sleep aid. Appetite is good with normal energy level. Rates his depression and hopelessness a 3 and anxiety a 0. His goal for today is to stay positive and keep away from negativity and keep God first. Patient remains safe with q 15 minute safety checks.

## 2018-06-09 NOTE — [facility] Group Notes (Signed)
LCSW Group Therapy Note  06/09/2018 1:15pm  Type of Therapy and Topic:  Group Therapy:  Cognitive Distortions  Participation Level:  Active   Description of Group:    Patients in this group will be introduced to the topic of cognitive distortions.  Patients will identify and describe cognitive distortions, describe the feelings these distortions create for them.  Patients will identify one or more situations in their personal life where they have cognitively distorted thinking and will verbalize challenging this cognitive distortion through positive thinking skills.  Patients will practice the skill of using positive affirmations to challenge cognitive distortions using affirmation cards.    Therapeutic Goals:  1. Patient will identify two or more cognitive distortions they have used 2. Patient will identify one or more emotions that stem from use of a cognitive distortion 3. Patient will demonstrate use of a positive affirmation to counter a cognitive distortion through discussion and/or role play. 4. Patient will describe one way cognitive distortions can be detrimental to wellness   Summary of Patient Progress: The patient reported that he feels "okay." Patients were introduced to the topic of cognitive distortions. The patient was able to identify and describe cognitive distortions. The patient identified a situation in their personal life where he has cognitively distorted thinking and was able to verbalize and challenged this cognitive distortion through positive thinking skills. Patient was able to provide support and validation to other group members.     Therapeutic Modalities:   Cognitive Behavioral Therapy Motivational Interviewing   Kenita Bines  CUEBAS-COLON, LCSW 06/09/2018 12:04 PM

## 2018-06-09 NOTE — Plan of Care (Signed)
D: Patient has been pleasant and cooperative on the unit. Denies SI, HI and AV hallucinations. Has been writing in his journal. Has written a letter to his mother that he was asked to write by his physician. Refusing oral medication. Voicing no complaints. Mood is sad. Affect is blunted. A: Continue to monitor and offer support. R: Safety maintained 

## 2018-06-10 MED ORDER — INFLUENZA VAC SPLIT QUAD 0.5 ML IM SUSY: 0.5000 mL | PREFILLED_SYRINGE | INTRAMUSCULAR | Status: DC

## 2018-06-10 NOTE — Progress Notes (Signed)
[facility] MD Progress Note  06/10/2018 8:57 PM Chris [name]  MRN:  [id number]  Subjective:    Mr. Chris [name] is still delusional, paranoid and very disorganized in his thinking. Still ring in copious notes and takes notes. Fixated on his family again. He has more information on his lawyer. He wrote him a letter and wants to mail it tomorrow.  Principal Problem: Schizoaffective disorder, bipolar type (HCC) Diagnosis: Principal Problem:   Schizoaffective disorder, bipolar type (HCC)  Total Time spent with patient: 20 minutes  Past Psychiatric History: schizohrenia  Past Medical History:  Past Medical History:  Diagnosis Date  . Anxiety   . Bipolar 2 disorder (HCC)   . Depression   . Schizophrenia (HCC)    History reviewed. No pertinent surgical history. Family History:  Family History  Problem Relation Age of Onset  . Bipolar disorder Other   . Schizophrenia Other    Family Psychiatric  History: none reported Social History:  Social History   Substance and Sexual Activity  Alcohol Use Yes  . Alcohol/week: 2.0 standard drinks  . Types: 2 Standard drinks or equivalent per week     Social History   Substance and Sexual Activity  Drug Use Yes  . Types: Marijuana    Social History   Socioeconomic History  . Marital status: Single    Spouse name: Not on file  . Number of children: Not on file  . Years of education: Not on file  . Highest education level: Not on file  Occupational History  . Not on file  Social Needs  . Financial resource strain: Not on file  . Food insecurity:    Worry: Not on file    Inability: Not on file  . Transportation needs:    Medical: Not on file    Non-medical: Not on file  Tobacco Use  . Smoking status: Current Every Day Smoker    Packs/day: 0.50  . Smokeless tobacco: Current User  Substance and Sexual Activity  . Alcohol use: Yes    Alcohol/week: 2.0 standard drinks    Types: 2 Standard drinks or equivalent per week  . Drug use: Yes     Types: Marijuana  . Sexual activity: Yes  Lifestyle  . Physical activity:    Days per week: Not on file    Minutes per session: Not on file  . Stress: Not on file  Relationships  . Social connections:    Talks on phone: Not on file    Gets together: Not on file    Attends religious service: Not on file    Active member of club or organization: Not on file    Attends meetings of clubs or organizations: Not on file    Relationship status: Not on file  Other Topics Concern  . Not on file  Social History Narrative  . Not on file   Additional Social History:                         Sleep: Fair  Appetite:  Fair  Current Medications: Current Facility-Administered Medications  Medication Dose Route Frequency Provider Last Rate Last Dose  . acetaminophen (TYLENOL) tablet 650 mg  650 mg Oral Q6H PRN Mariel Craft, MD      . alum & mag hydroxide-simeth (MAALOX/MYLANTA) 200-200-20 MG/5ML suspension 30 mL  30 mL Oral Q4H PRN Mariel Craft, MD      . diphenhydrAMINE (BENADRYL) capsule 50 mg  50 mg  Oral QHS PRN Mariel Craft, MD   50 mg at 06/07/18 0813  . hydrOXYzine (ATARAX/VISTARIL) tablet 25 mg  25 mg Oral Q6H PRN Mariel Craft, MD      . ibuprofen (ADVIL,MOTRIN) tablet 400 mg  400 mg Oral BID PRN Mariel Craft, MD      . Melene Muller ON 06/11/2018] Influenza vac split quadrivalent PF (FLUARIX) injection 0.5 mL  0.5 mL Intramuscular Tomorrow-1000 Josy Peaden B, MD      . magnesium hydroxide (MILK OF MAGNESIA) suspension 30 mL  30 mL Oral Daily PRN Mariel Craft, MD      . paliperidone (INVEGA SUSTENNA) injection 234 mg  234 mg Intramuscular Q28 days Gaspard Isbell B, MD   234 mg at 06/06/18 1601  . temazepam (RESTORIL) capsule 30 mg  30 mg Oral QHS Aveah Castell B, MD        Lab Results: No results found for this or any previous visit (from the past 48 hour(s)).  Blood Alcohol level:  Lab Results  Component Value Date   ETH <10 06/05/2018    ETH 67 (H) 09/26/2017    Metabolic Disorder Labs: Lab Results  Component Value Date   HGBA1C 5.6 06/05/2018   MPG 114.02 06/05/2018   MPG 94 12/01/2015   Lab Results  Component Value Date   PROLACTIN 58.4 (H) 12/01/2015   Lab Results  Component Value Date   CHOL 111 06/05/2018   TRIG 66 06/05/2018   HDL 38 (L) 06/05/2018   CHOLHDL 2.9 06/05/2018   VLDL 13 06/05/2018   LDLCALC 60 06/05/2018   LDLCALC 50 12/01/2015    Physical Findings: AIMS: Facial and Oral Movements Muscles of Facial Expression: None, normal Lips and Perioral Area: None, normal Jaw: None, normal Tongue: None, normal,Extremity Movements Upper (arms, wrists, hands, fingers): None, normal Lower (legs, knees, ankles, toes): None, normal, Trunk Movements Neck, shoulders, hips: None, normal, Overall Severity Severity of abnormal movements (highest score from questions above): None, normal Incapacitation due to abnormal movements: None, normal Patient's awareness of abnormal movements (rate only patient's report): No Awareness, Dental Status Current problems with teeth and/or dentures?: No Does patient usually wear dentures?: No  CIWA:    COWS:     Musculoskeletal: Strength & Muscle Tone: within normal limits Gait & Station: normal Patient leans: N/A  Psychiatric Specialty Exam: Physical Exam  Nursing note and vitals reviewed. Psychiatric: His affect is blunt. His speech is delayed. He is withdrawn. Thought content is paranoid and delusional. Cognition and memory are impaired. He expresses impulsivity.    Review of Systems  Neurological: Negative.   Psychiatric/Behavioral: Positive for hallucinations.  All other systems reviewed and are negative.   Blood pressure 126/77, pulse 92, temperature 98.7 F (37.1 C), temperature source Oral, resp. rate 18, height 5' 11.65" (1.82 m), weight 79.4 kg, SpO2 100 %.Body mass index is 23.96 kg/m.  General Appearance: Casual  Eye Contact:  Good  Speech:   Slow  Volume:  Decreased  Mood:  Euphoric  Affect:  Congruent  Thought Process:  Irrelevant  Orientation:  Full (Time, Place, and Person)  Thought Content:  Illogical, Delusions, Paranoid Ideation, Rumination and Tangential  Suicidal Thoughts:  No  Homicidal Thoughts:  No  Memory:  Immediate;   Poor Recent;   Poor Remote;   Poor  Judgement:  Poor  Insight:  Lacking  Psychomotor Activity:  Normal  Concentration:  Concentration: Poor and Attention Span: Poor  Recall:  Poor  Fund of Knowledge:  Fair  Language:  Poor  Akathisia:  No  Handed:  Right  AIMS (if indicated):     Assets:  Communication Skills Desire for Improvement Physical Health  ADL's:  Intact  Cognition:  WNL  Sleep:  Number of Hours: 7     Treatment Plan Summary: Daily contact with patient to assess and evaluate symptoms and progress in treatment and Medication management   Mr. Brumbach is a 33 year old male with a history of bipolar disoder admitted for amanic episode in the context of treatment noncomplinace.  #Psychosis -continueInvaga sustenna 234 mgevery 28 days, first injectionon 06/06/2018, nextdoseon 07/04/2018 -156 mg of Invega given 06/10/2018  #Insomnia, slept 5 hours only -lower Restori to 15 mg nightly  #Labs -lipid panel, TSH, A1Care normal -EKG, NSR with QTc 457  #Social -legal charges pending  #Disposition -patient is homeless -lives in [place] or in [place], MD 06/10/2018, 8:57 PM

## 2018-06-10 NOTE — Progress Notes (Signed)
Second Invega injection given in R) deltoid. No adverse reaction noted at this time.

## 2018-06-10 NOTE — [facility] Group Notes (Signed)
LCSW Group Therapy Note 06/10/2018 1:15pm  Type of Therapy and Topic: Group Therapy: Feelings Around Returning Home & Establishing a Supportive Framework and Supporting Oneself When Supports Not Available  Participation Level: Active  Description of Group:  Patients first processed thoughts and feelings about upcoming discharge. These included fears of upcoming changes, lack of change, new living environments, judgements and expectations from others and overall stigma of mental health issues. The group then discussed the definition of a supportive framework, what that looks and feels like, and how do to discern it from an unhealthy non-supportive network. The group identified different types of supports as well as what to do when your family/friends are less than helpful or unavailable  Therapeutic Goals  1. Patient will identify one healthy supportive network that they can use at discharge. 2. Patient will identify one factor of a supportive framework and how to tell it from an unhealthy network. 3. Patient able to identify one coping skill to use when they do not have positive supports from others. 4. Patient will demonstrate ability to communicate their needs through discussion and/or role plays.  Summary of Patient Progress:  The patient reported he feels "happy." Pt engaged during group session. As patients processed their anxiety about discharge and described healthy supports patient shared he is ready to be discharge. Patients identified at least one self-care tool they were willing to use after discharge.   Therapeutic Modalities Cognitive Behavioral Therapy Motivational Interviewing   Aviel Davalos  CUEBAS-COLON, LCSW 06/10/2018 9:45 AM

## 2018-06-11 NOTE — [facility] Group Notes (Signed)
[facility] Group Notes:  (Nursing/MHT/Case Management/Adjunct)  Date:  06/11/2018  Time:  10:27 PM  Type of Therapy:  Group Therapy  Participation Level:  Active  Participation Quality:  Appropriate  Affect:  Appropriate  Cognitive:  Appropriate  Insight:  Appropriate  Engagement in Group:  Engaged  Modes of Intervention:  Discussion  Summary of Progress/Problems:  Burt Ek 06/11/2018, 10:27 PM

## 2018-06-11 NOTE — Tx Team (Signed)
Interdisciplinary Treatment and Diagnostic Plan Update  06/11/2018 Time of Session: 830am Chris [name] MRN: [id number]  Principal Diagnosis: Schizoaffective disorder, bipolar type [facility])  Secondary Diagnoses: Principal Problem:   Schizoaffective disorder, bipolar type ([place])   Current Medications:  Current Facility-Administered Medications  Medication Dose Route Frequency Provider Last Rate Last Dose  . acetaminophen (TYLENOL) tablet 650 mg  650 mg Oral Q6H PRN Lavella Hammock, MD      . alum & mag hydroxide-simeth (MAALOX/MYLANTA) 200-200-20 MG/5ML suspension 30 mL  30 mL Oral Q4H PRN Lavella Hammock, MD      . diphenhydrAMINE (BENADRYL) capsule 50 mg  50 mg Oral QHS PRN Lavella Hammock, MD   50 mg at 06/07/18 0813  . hydrOXYzine (ATARAX/VISTARIL) tablet 25 mg  25 mg Oral Q6H PRN Lavella Hammock, MD      . ibuprofen (ADVIL,MOTRIN) tablet 400 mg  400 mg Oral BID PRN Lavella Hammock, MD      . Influenza vac split quadrivalent PF (FLUARIX) injection 0.5 mL  0.5 mL Intramuscular Tomorrow-1000 Pucilowska, Jolanta B, MD      . magnesium hydroxide (MILK OF MAGNESIA) suspension 30 mL  30 mL Oral Daily PRN Lavella Hammock, MD      . paliperidone (INVEGA SUSTENNA) injection 234 mg  234 mg Intramuscular Q28 days Pucilowska, Jolanta B, MD   234 mg at 06/06/18 1601  . temazepam (RESTORIL) capsule 30 mg  30 mg Oral QHS Pucilowska, Jolanta B, MD       PTA Medications: Medications Prior to Admission  Medication Sig Dispense Refill Last Dose  . ibuprofen (ADVIL,MOTRIN) 200 MG tablet Take 400 mg by mouth 2 (two) times daily as needed for moderate pain.   Past Week at Unknown time    Patient Stressors: Financial difficulties Loss of Homeless Medication change or noncompliance Substance abuse  Patient Strengths: Ability for insight Average or above average intelligence Capable of independent living  Treatment Modalities: Medication Management, Group therapy, Case management,  1 to 1 session  with clinician, Psychoeducation, Recreational therapy.   Physician Treatment Plan for Primary Diagnosis: Schizoaffective disorder, bipolar type ([place]) Long Term Goal(s): Improvement in symptoms so as ready for discharge Improvement in symptoms so as ready for discharge   Short Term Goals: Ability to identify changes in lifestyle to reduce recurrence of condition will improve Ability to verbalize feelings will improve Ability to disclose and discuss suicidal ideas Ability to demonstrate self-control will improve Ability to identify and develop effective coping behaviors will improve Ability to maintain clinical measurements within normal limits will improve Compliance with prescribed medications will improve Ability to identify triggers associated with substance abuse/mental health issues will improve NA  Medication Management: Evaluate patient's response, side effects, and tolerance of medication regimen.  Therapeutic Interventions: 1 to 1 sessions, Unit Group sessions and Medication administration.  Evaluation of Outcomes: Not Met  Physician Treatment Plan for Secondary Diagnosis: Principal Problem:   Schizoaffective disorder, bipolar type ([place])  Long Term Goal(s): Improvement in symptoms so as ready for discharge Improvement in symptoms so as ready for discharge   Short Term Goals: Ability to identify changes in lifestyle to reduce recurrence of condition will improve Ability to verbalize feelings will improve Ability to disclose and discuss suicidal ideas Ability to demonstrate self-control will improve Ability to identify and develop effective coping behaviors will improve Ability to maintain clinical measurements within normal limits will improve Compliance with prescribed medications will improve Ability to identify triggers associated with substance  abuse/mental health issues will improve NA     Medication Management: Evaluate patient's response, side effects, and  tolerance of medication regimen.  Therapeutic Interventions: 1 to 1 sessions, Unit Group sessions and Medication administration.  Evaluation of Outcomes: Not Met   RN Treatment Plan for Primary Diagnosis: Schizoaffective disorder, bipolar type ([place]) Long Term Goal(s): Knowledge of disease and therapeutic regimen to maintain health will improve  Short Term Goals: Ability to demonstrate self-control, Ability to participate in decision making will improve, Ability to identify and develop effective coping behaviors will improve and Compliance with prescribed medications will improve  Medication Management: RN will administer medications as ordered by provider, will assess and evaluate patient's response and provide education to patient for prescribed medication. RN will report any adverse and/or side effects to prescribing provider.  Therapeutic Interventions: 1 on 1 counseling sessions, Psychoeducation, Medication administration, Evaluate responses to treatment, Monitor vital signs and CBGs as ordered, Perform/monitor CIWA, COWS, AIMS and Fall Risk screenings as ordered, Perform wound care treatments as ordered.  Evaluation of Outcomes: Not Met   LCSW Treatment Plan for Primary Diagnosis: Schizoaffective disorder, bipolar type ([place]) Long Term Goal(s): Safe transition to appropriate next level of care at discharge, Engage patient in therapeutic group addressing interpersonal concerns.  Short Term Goals: Engage patient in aftercare planning with referrals and resources  Therapeutic Interventions: Assess for all discharge needs, 1 to 1 time with Social worker, Explore available resources and support systems, Assess for adequacy in community support network, Educate family and significant other(s) on suicide prevention, Complete Psychosocial Assessment, Interpersonal group therapy.  Evaluation of Outcomes: Not Met   Progress in Treatment: Attending groups: Yes. Participating in groups:  Yes. Taking medication as prescribed: Yes. Toleration medication: Yes. Family/Significant other contact made: No, will contact:  pt has not given consent and presents in a disorganized manner at this time Patient understands diagnosis: No. Discussing patient identified problems/goals with staff: Yes. Medical problems stabilized or resolved: No. Denies suicidal/homicidal ideation: Yes. Issues/concerns per patient self-inventory: No. Other: NA  New problem(s) identified: No, Describe:  none reported  New Short Term/Long Term Goal(s): "Get my daughter back in my life. Remain positive, remain humble"  Patient Goals: Get my daughter back in my life. Remain positive, remain humble"  Discharge Plan or Barriers: Pt is homeless, no income, unsure about support system, unsure about legal history. Discharge plan is tbd. 06/11/18-Pt has court date 3/23 in [place] presents with disorganized thinking and paranoia, unable to participate in discharge planning at this time. D/C plan is TBD  Reason for Continuation of Hospitalization: Medication stabilization  Estimated Length of Stay: TBD  Recreational Therapy: Patient Stressors: N/A Patient Goal: Patient will focus on task/topic with 2 prompts from staff within 5 recreation therapy group sessions  Attendees: Patient: 06/11/2018 1:37 PM  Physician: Herma Ard Pucilowska 06/11/2018 1:37 PM  Nursing: Nat Math Ravenell 06/11/2018 1:37 PM  RN Care Manager: 06/11/2018 1:37 PM  Social Worker: Sanjuana Kava LCSW Olivia Moton LCSW Ethel Rana LCSW 06/11/2018 1:37 PM  Recreational Therapist:  06/11/2018 1:37 PM  Other:  06/11/2018 1:37 PM  Other:  06/11/2018 1:37 PM  Other: 06/11/2018 1:37 PM    Scribe for Treatment Team: Yvette Rack, LCSW 06/11/2018 1:37 PM

## 2018-06-11 NOTE — Progress Notes (Signed)
Recreation Therapy Notes  Date: 06/11/2018  Time: 9:30 am  Location: Craft Room  Behavioral response: Appropriate   Intervention Topic: Strengths  Discussion/Intervention:  Group content today was focused on strengths. The group identified some of the strengths they have. Individuals stated reason why they do not use their strengths. Patients expressed what strengths others see in them. The group identified important reason to use their strengths. The group participated in the intervention "Picking strengths", where they had a chance to identify some of their strengths. Clinical Observations/Feedback:  Patient came to group and explained that he has a lot of wisdom and that is a strength he has. He participated in the intervention during group. [place] LRT/CTRS         Twania Bujak 06/11/2018 12:02 PM

## 2018-06-11 NOTE — Progress Notes (Signed)
[facility] MD Progress Note  06/11/2018 12:02 PM Telly Jawad  MRN:  [id number]  Subjective:    Mr. Rod is still very disorganized in his thinking, religiously preoccupied and paranoid. He was supposed to mail a letter to his lawyer but apparently "send" a letter to his mother and a male friend this morning. This is unlikely. I do not know how to send a letter from the hospital. Tolerates Invega well but keeps talking about his tongue hanging out, not the case, that his grandmother warned him about. She never took any medications herself.   Principal Problem: Schizoaffective disorder, bipolar type ([facility]) Diagnosis: Principal Problem:   Schizoaffective disorder, bipolar type ([facility])  Total Time spent with patient: 15 minutes  Past Psychiatric History: schizophrenia  Past Medical History:  Past Medical History:  Diagnosis Date  . Anxiety   . Bipolar 2 disorder ([facility])   . Depression   . Schizophrenia ([facility])    History reviewed. No pertinent surgical history. Family History:  Family History  Problem Relation Age of Onset  . Bipolar disorder Other   . Schizophrenia Other    Family Psychiatric  History: none Social History:  Social History   Substance and Sexual Activity  Alcohol Use Yes  . Alcohol/week: 2.0 standard drinks  . Types: 2 Standard drinks or equivalent per week     Social History   Substance and Sexual Activity  Drug Use Yes  . Types: Marijuana    Social History   Socioeconomic History  . Marital status: Single    Spouse name: Not on file  . Number of children: Not on file  . Years of education: Not on file  . Highest education level: Not on file  Occupational History  . Not on file  Social Needs  . Financial resource strain: Not on file  . Food insecurity:    Worry: Not on file    Inability: Not on file  . Transportation needs:    Medical: Not on file    Non-medical: Not on file  Tobacco Use  . Smoking status: Current Every Day Smoker    Packs/day:  0.50  . Smokeless tobacco: Current User  Substance and Sexual Activity  . Alcohol use: Yes    Alcohol/week: 2.0 standard drinks    Types: 2 Standard drinks or equivalent per week  . Drug use: Yes    Types: Marijuana  . Sexual activity: Yes  Lifestyle  . Physical activity:    Days per week: Not on file    Minutes per session: Not on file  . Stress: Not on file  Relationships  . Social connections:    Talks on phone: Not on file    Gets together: Not on file    Attends religious service: Not on file    Active member of club or organization: Not on file    Attends meetings of clubs or organizations: Not on file    Relationship status: Not on file  Other Topics Concern  . Not on file  Social History Narrative  . Not on file   Additional Social History:                         Sleep: Fair  Appetite:  Poor  Current Medications: Current Facility-Administered Medications  Medication Dose Route Frequency Provider Last Rate Last Dose  . acetaminophen (TYLENOL) tablet 650 mg  650 mg Oral Q6H PRN Mariel Craft, MD      .  alum & mag hydroxide-simeth (MAALOX/MYLANTA) 200-200-20 MG/5ML suspension 30 mL  30 mL Oral Q4H PRN Mariel Craft, MD      . diphenhydrAMINE (BENADRYL) capsule 50 mg  50 mg Oral QHS PRN Mariel Craft, MD   50 mg at 06/07/18 0813  . hydrOXYzine (ATARAX/VISTARIL) tablet 25 mg  25 mg Oral Q6H PRN Mariel Craft, MD      . ibuprofen (ADVIL,MOTRIN) tablet 400 mg  400 mg Oral BID PRN Mariel Craft, MD      . Influenza vac split quadrivalent PF (FLUARIX) injection 0.5 mL  0.5 mL Intramuscular Tomorrow-1000 Deshon Koslowski B, MD      . magnesium hydroxide (MILK OF MAGNESIA) suspension 30 mL  30 mL Oral Daily PRN Mariel Craft, MD      . paliperidone (INVEGA SUSTENNA) injection 234 mg  234 mg Intramuscular Q28 days Stepen Prins B, MD   234 mg at 06/06/18 1601  . temazepam (RESTORIL) capsule 30 mg  30 mg Oral QHS Aspin Palomarez B, MD         Lab Results: No results found for this or any previous visit (from the past 48 hour(s)).  Blood Alcohol level:  Lab Results  Component Value Date   ETH <10 06/05/2018   ETH 67 (H) 09/26/2017    Metabolic Disorder Labs: Lab Results  Component Value Date   HGBA1C 5.6 06/05/2018   MPG 114.02 06/05/2018   MPG 94 12/01/2015   Lab Results  Component Value Date   PROLACTIN 58.4 (H) 12/01/2015   Lab Results  Component Value Date   CHOL 111 06/05/2018   TRIG 66 06/05/2018   HDL 38 (L) 06/05/2018   CHOLHDL 2.9 06/05/2018   VLDL 13 06/05/2018   LDLCALC 60 06/05/2018   LDLCALC 50 12/01/2015    Physical Findings: AIMS: Facial and Oral Movements Muscles of Facial Expression: None, normal Lips and Perioral Area: None, normal Jaw: None, normal Tongue: None, normal,Extremity Movements Upper (arms, wrists, hands, fingers): None, normal Lower (legs, knees, ankles, toes): None, normal, Trunk Movements Neck, shoulders, hips: None, normal, Overall Severity Severity of abnormal movements (highest score from questions above): None, normal Incapacitation due to abnormal movements: None, normal Patient's awareness of abnormal movements (rate only patient's report): No Awareness, Dental Status Current problems with teeth and/or dentures?: No Does patient usually wear dentures?: No  CIWA:    COWS:     Musculoskeletal: Strength & Muscle Tone: within normal limits Gait & Station: normal Patient leans: N/A  Psychiatric Specialty Exam: Physical Exam  Nursing note and vitals reviewed. Psychiatric: His affect is blunt. His speech is delayed. He is slowed and withdrawn. Thought content is paranoid and delusional. Cognition and memory are impaired. He expresses impulsivity.    Review of Systems  Neurological: Negative.   Psychiatric/Behavioral: Negative.   All other systems reviewed and are negative.   Blood pressure 126/77, pulse 92, temperature 98.7 F (37.1 C), temperature  source Oral, resp. rate 18, height 5' 11.65" (1.82 m), weight 79.4 kg, SpO2 100 %.Body mass index is 23.96 kg/m.  General Appearance: Casual  Eye Contact:  Good  Speech:  Slow  Volume:  Decreased  Mood:  Anxious  Affect:  Blunt  Thought Process:  Irrelevant  Orientation:  Full (Time, Place, and Person)  Thought Content:  Delusions and Paranoid Ideation  Suicidal Thoughts:  No  Homicidal Thoughts:  No  Memory:  Immediate;   Poor Recent;   Poor Remote;   Poor  Judgement:  Poor  Insight:  Lacking  Psychomotor Activity:  Decreased  Concentration:  Concentration: Poor and Attention Span: Poor  Recall:  Poor  Fund of Knowledge:  Poor  Language:  Poor  Akathisia:  No  Handed:  Right  AIMS (if indicated):     Assets:  Communication Skills Desire for Improvement Physical Health Resilience  ADL's:  Intact  Cognition:  WNL  Sleep:  Number of Hours: 4.75     Treatment Plan Summary: Daily contact with patient to assess and evaluate symptoms and progress in treatment and Medication management   Mr. Pianka is a 33 year old male with a history of bipolar disoderadmitted for amanic episode in the context of treatment noncomplinace.  #Psychosis -continueInvaga sustenna 234 mgevery 28 days, first injectionon 06/06/2018, nextdoseon 07/04/2018 -156 mg of Invega given 06/10/2018  #Insomnia, slept 5 hours only -lower Restori to 15 mg nightly  #Labs -lipid panel, TSH, A1Care normal -EKG, NSR with QTc 457  #Social -legal charges pending  #Disposition -patient is homeless -lives in [place] or in [place], MD 06/11/2018, 12:02 PM

## 2018-06-11 NOTE — [facility] Group Notes (Signed)
.  LCSW Group Therapy Note   06/11/2018 1:00 PM  Type of Therapy and Topic:  Group Therapy:  Overcoming Obstacles   Participation Level:  Active   Description of Group:    In this group patients will be encouraged to explore what they see as obstacles to their own wellness and recovery. They will be guided to discuss their thoughts, feelings, and behaviors related to these obstacles. The group will process together ways to cope with barriers, with attention given to specific choices patients can make. Each patient will be challenged to identify changes they are motivated to make in order to overcome their obstacles. This group will be process-oriented, with patients participating in exploration of their own experiences as well as giving and receiving support and challenge from other group members.   Therapeutic Goals: 1. Patient will identify personal and current obstacles as they relate to admission. 2. Patient will identify barriers that currently interfere with their wellness or overcoming obstacles.  3. Patient will identify feelings, thought process and behaviors related to these barriers. 4. Patient will identify two changes they are willing to make to overcome these obstacles:      Summary of Patient Progress Patient was present for group and attentive.  Patient was supportive of other group members.  Patient identified not having a relationship with his young daughter as being his biggest obstacle.  Patient engaged in discussion on how to challenge negative emotions and obstacles and discussed using journaling.     Therapeutic Modalities:   Cognitive Behavioral Therapy Solution Focused Therapy Motivational Interviewing Relapse Prevention Therapy  Penni Homans, MSW, LCSW 06/11/2018 2:29 PM

## 2018-06-11 NOTE — Plan of Care (Signed)
Patient present in the milieu with steady gait. Denies having SI/HI/AVH and pain at this time. Reports that he slept fair without the use of a sleep aid. Appetite is good with normal energy level. Rates his depression a 0, hopelessness a and anxiety a 0. His goal for today is to stay humbled and positive and to wash his clothes. Patient remains safe with q 15 minute safety checks.

## 2018-06-11 NOTE — Plan of Care (Signed)
D: Patient has been more social on the unit. Was observed playing cards with the other patients in the dayroom, smiling and laughing. Continues to deny SI, HI and AV hallucinations. He wrote two letters addressed to his mother apologizing for his actions. He contracts for safety on the unit, but continues to refuse oral medication.  A: Continue to monitor for safety and offer support. R: Safety maintained.

## 2018-06-11 NOTE — Progress Notes (Signed)
D: Patient has been more social on the unit. Was observed playing cards with the other patients in the dayroom, smiling and laughing. Continues to deny SI, HI and AV hallucinations. He wrote two letters addressed to his mother apologizing for his actions. He contracts for safety on the unit, but continues to refuse oral medication.  A: Continue to monitor for safety and offer support. R: Safety maintained. 

## 2018-06-12 MED ORDER — NICOTINE 21 MG/24HR TD PT24
21.0000 mg | MEDICATED_PATCH | Freq: Every day | TRANSDERMAL | Status: DC
  Administered 2018-06-12 – 2018-06-19 (×8): 21 mg via TRANSDERMAL
  Filled 2018-06-12 (×8): qty 1

## 2018-06-12 NOTE — Progress Notes (Signed)
D: Patient presents with clearer thought processes than upon admission.  He does not appear to be responding to internal stimuli as much today.  He is coherent and logical with speech.  His affect is flat, blunted; his mood appears stable.  He denies any thoughts of self harm.  Patient reports fair sleep; good appetite; normal energy level and good concentration.  He denies any depressive symptoms or anxiety. He would like to call his mother today.  A: Continue to monitor medication management and MD orders.  Safety checks completed every 15 minutes per protocol.  Offer support and encouragement as needed.  R: Patient is receptive to staff; his/her behavior is appropriate.

## 2018-06-12 NOTE — Progress Notes (Signed)
[facility] MD Progress Note  06/12/2018 7:57 PM Chris [name]  MRN:  [id number] Subjective: Follow-up for this patient with schizoaffective disorder.  Patient has no new complaints.  He tells me that what brought him into the hospital with "bad thoughts".  When I ask him how the bad thoughts were now he said that he had a nicotine patch on and so that was good.  This is an example of how he is still disorganized in his thinking.  He tells me he will be staying with family when he gets out of the hospital.  I asked him which family and he could not name anyone.  Affect is pleasant.  Takes care of his basic hygiene okay.  Thinks that he should be discharged tomorrow although it does not seem like there is a really clear plan in place and I think we are still waiting to get him a second shot in a few days. Principal Problem: Schizoaffective disorder, bipolar type (HCC) Diagnosis: Principal Problem:   Schizoaffective disorder, bipolar type (HCC)  Total Time spent with patient: 30 minutes  Past Psychiatric History: History of schizoaffective disorder.  Past Medical History:  Past Medical History:  Diagnosis Date  . Anxiety   . Bipolar 2 disorder (HCC)   . Depression   . Schizophrenia (HCC)    History reviewed. No pertinent surgical history. Family History:  Family History  Problem Relation Age of Onset  . Bipolar disorder Other   . Schizophrenia Other    Family Psychiatric  History: See previous Social History:  Social History   Substance and Sexual Activity  Alcohol Use Yes  . Alcohol/week: 2.0 standard drinks  . Types: 2 Standard drinks or equivalent per week     Social History   Substance and Sexual Activity  Drug Use Yes  . Types: Marijuana    Social History   Socioeconomic History  . Marital status: Single    Spouse name: Not on file  . Number of children: Not on file  . Years of education: Not on file  . Highest education level: Not on file  Occupational History  . Not on  file  Social Needs  . Financial resource strain: Not on file  . Food insecurity:    Worry: Not on file    Inability: Not on file  . Transportation needs:    Medical: Not on file    Non-medical: Not on file  Tobacco Use  . Smoking status: Current Every Day Smoker    Packs/day: 0.50  . Smokeless tobacco: Current User  Substance and Sexual Activity  . Alcohol use: Yes    Alcohol/week: 2.0 standard drinks    Types: 2 Standard drinks or equivalent per week  . Drug use: Yes    Types: Marijuana  . Sexual activity: Yes  Lifestyle  . Physical activity:    Days per week: Not on file    Minutes per session: Not on file  . Stress: Not on file  Relationships  . Social connections:    Talks on phone: Not on file    Gets together: Not on file    Attends religious service: Not on file    Active member of club or organization: Not on file    Attends meetings of clubs or organizations: Not on file    Relationship status: Not on file  Other Topics Concern  . Not on file  Social History Narrative  . Not on file   Additional Social History:  Sleep: Fair  Appetite:  Fair  Current Medications: Current Facility-Administered Medications  Medication Dose Route Frequency Provider Last Rate Last Dose  . acetaminophen (TYLENOL) tablet 650 mg  650 mg Oral Q6H PRN Mariel Craft, MD      . alum & mag hydroxide-simeth (MAALOX/MYLANTA) 200-200-20 MG/5ML suspension 30 mL  30 mL Oral Q4H PRN Mariel Craft, MD      . diphenhydrAMINE (BENADRYL) capsule 50 mg  50 mg Oral QHS PRN Mariel Craft, MD   50 mg at 06/07/18 0813  . hydrOXYzine (ATARAX/VISTARIL) tablet 25 mg  25 mg Oral Q6H PRN Mariel Craft, MD      . ibuprofen (ADVIL,MOTRIN) tablet 400 mg  400 mg Oral BID PRN Mariel Craft, MD      . Influenza vac split quadrivalent PF (FLUARIX) injection 0.5 mL  0.5 mL Intramuscular Tomorrow-1000 Pucilowska, Jolanta B, MD      . magnesium hydroxide (MILK OF  MAGNESIA) suspension 30 mL  30 mL Oral Daily PRN Mariel Craft, MD      . nicotine (NICODERM CQ - dosed in mg/24 hours) patch 21 mg  21 mg Transdermal Daily Pucilowska, Jolanta B, MD   21 mg at 06/12/18 0825  . paliperidone (INVEGA SUSTENNA) injection 234 mg  234 mg Intramuscular Q28 days Pucilowska, Jolanta B, MD   234 mg at 06/06/18 1601  . temazepam (RESTORIL) capsule 30 mg  30 mg Oral QHS Pucilowska, Jolanta B, MD        Lab Results: No results found for this or any previous visit (from the past 48 hour(s)).  Blood Alcohol level:  Lab Results  Component Value Date   ETH <10 06/05/2018   ETH 67 (H) 09/26/2017    Metabolic Disorder Labs: Lab Results  Component Value Date   HGBA1C 5.6 06/05/2018   MPG 114.02 06/05/2018   MPG 94 12/01/2015   Lab Results  Component Value Date   PROLACTIN 58.4 (H) 12/01/2015   Lab Results  Component Value Date   CHOL 111 06/05/2018   TRIG 66 06/05/2018   HDL 38 (L) 06/05/2018   CHOLHDL 2.9 06/05/2018   VLDL 13 06/05/2018   LDLCALC 60 06/05/2018   LDLCALC 50 12/01/2015    Physical Findings: AIMS: Facial and Oral Movements Muscles of Facial Expression: None, normal Lips and Perioral Area: None, normal Jaw: None, normal Tongue: None, normal,Extremity Movements Upper (arms, wrists, hands, fingers): None, normal Lower (legs, knees, ankles, toes): None, normal, Trunk Movements Neck, shoulders, hips: None, normal, Overall Severity Severity of abnormal movements (highest score from questions above): None, normal Incapacitation due to abnormal movements: None, normal Patient's awareness of abnormal movements (rate only patient's report): No Awareness, Dental Status Current problems with teeth and/or dentures?: No Does patient usually wear dentures?: No  CIWA:    COWS:     Musculoskeletal: Strength & Muscle Tone: within normal limits Gait & Station: normal Patient leans: N/A  Psychiatric Specialty Exam: Physical Exam  Nursing note  and vitals reviewed. Constitutional: He appears well-developed and well-nourished.  HENT:  Head: Normocephalic and atraumatic.  Eyes: Pupils are equal, round, and reactive to light. Conjunctivae are normal.  Neck: Normal range of motion.  Cardiovascular: Regular rhythm and normal heart sounds.  Respiratory: Effort normal. No respiratory distress.  GI: Soft.  Musculoskeletal: Normal range of motion.  Neurological: He is alert.  Skin: Skin is warm and dry.  Psychiatric: His affect is blunt. His speech is tangential. He is not agitated  and not aggressive. Thought content is delusional. Cognition and memory are impaired. He expresses impulsivity.    Review of Systems  Constitutional: Negative.   HENT: Negative.   Eyes: Negative.   Respiratory: Negative.   Cardiovascular: Negative.   Gastrointestinal: Negative.   Musculoskeletal: Negative.   Skin: Negative.   Neurological: Negative.   Psychiatric/Behavioral: Negative for depression, hallucinations, memory loss, substance abuse and suicidal ideas. The patient is not nervous/anxious and does not have insomnia.     Blood pressure 125/76, pulse 88, temperature 98.3 F (36.8 C), temperature source Oral, resp. rate 18, height 5' 11.65" (1.82 m), weight 79.4 kg, SpO2 100 %.Body mass index is 23.96 kg/m.  General Appearance: Casual  Eye Contact:  Fair  Speech:  Slow  Volume:  Decreased  Mood:  Dysphoric  Affect:  Flat  Thought Process:  Coherent  Orientation:  Full (Time, Place, and Person)  Thought Content:  Rumination  Suicidal Thoughts:  No  Homicidal Thoughts:  No  Memory:  Immediate;   Fair Recent;   Fair Remote;   Fair  Judgement:  Fair  Insight:  Fair  Psychomotor Activity:  Decreased  Concentration:  Concentration: Fair  Recall:  [employer] of Knowledge:  Fair  Language:  Fair  Akathisia:  No  Handed:  Right  AIMS (if indicated):     Assets:  Desire for Improvement Physical Health Resilience  ADL's:  Intact   Cognition:  Impaired,  Mild  Sleep:  Number of Hours: 4.5     Treatment Plan Summary: Daily contact with patient to assess and evaluate symptoms and progress in treatment, Medication management and Plan No change to medication management.  Encouraged patient to think seriously about discharge goals.  Continue to assess improvement in psychosis.  Probable length of stay still in the order of 3 or 4 days.  Mordecai Rasmussen, MD 06/12/2018, 7:57 PM

## 2018-06-12 NOTE — Progress Notes (Signed)
D - Patient was in the dayroom upon arrival to the unit. Patient was pleasant during assessment and medication administration. Patient presented with a better thought process than he previously has had on the unit. Patient was able to hold a conversation with this Clinical research associate. Patient denies SI/HI/AVH, pain, anxiety and depression. Patient stated, "I am not ready to go yet but I will be soon. I feel better and my memory is coming back to me so I know I will be ready to go soon."   A - Patient refused his evening medication stating, "no thank you, I don't want to take any pills." Patient compliant with procedures on the unit. Patient given education. Patient given support and encouragement to be active in his treatment plan. Patient informed to let staff know if there are any issues or problems on the unit.   R - Patient being monitored Q 15 minutes for safety per unit protocol. Patient remains safe on the unit.

## 2018-06-12 NOTE — Plan of Care (Signed)
  Problem: Education: Goal: Knowledge of  General Education information/materials will improve Outcome: Adequate for Discharge   Problem: Coping: Goal: Ability to verbalize frustrations and anger appropriately will improve Outcome: Adequate for Discharge Goal: Ability to demonstrate self-control will improve Outcome: Adequate for Discharge

## 2018-06-12 NOTE — [facility] Group Notes (Signed)
Feelings Around Diagnosis 06/12/2018 1PM  Type of Therapy/Topic:  Group Therapy:  Feelings about Diagnosis  Participation Level:  Did Not Attend   Description of Group:   This group will allow patients to explore their thoughts and feelings about diagnoses they have received. Patients will be guided to explore their level of understanding and acceptance of these diagnoses. Facilitator will encourage patients to process their thoughts and feelings about the reactions of others to their diagnosis and will guide patients in identifying ways to discuss their diagnosis with significant others in their lives. This group will be process-oriented, with patients participating in exploration of their own experiences, giving and receiving support, and processing challenge from other group members.   Therapeutic Goals: 1. Patient will demonstrate understanding of diagnosis as evidenced by identifying two or more symptoms of the disorder 2. Patient will be able to express two feelings regarding the diagnosis 3. Patient will demonstrate their ability to communicate their needs through discussion and/or role play  Summary of Patient Progress:       Therapeutic Modalities:   Cognitive Behavioral Therapy Brief Therapy Feelings Identification    Sparkle Aube T Beverley Sherrard, LCSW 06/12/2018 4:31 PM  

## 2018-06-12 NOTE — Plan of Care (Signed)
Cooperative and pleasant on approach. Denying thoughts of self harm. Denying hallucinations "I am just ...taking one day at time". Patient is alert and oriented, reporting that his thought process is improving. No sign of distress. Was encouraged to express feelings and thoughts as needed. Safety monitored as ordered.

## 2018-06-12 NOTE — Plan of Care (Signed)
Patient presented with a better thought process than he has previously had on the unit. Patient was able to hold a conversation with this Clinical research associate. Patient stated he is feeling better.   Problem: Education: Goal: Emotional status will improve Outcome: Progressing Goal: Mental status will improve Outcome: Progressing

## 2018-06-12 NOTE — Progress Notes (Signed)
Recreation Therapy Notes   Date: 06/12/2018  Time: 9:30 am  Location: Craft Room  Behavioral response: Appropriate  Intervention Topic: Happiness  Discussion/Intervention:  Group content today was focused on Happiness. The group defined happiness and described where happiness comes from. Individuals identified what makes them happy and how they go about making others happy. Patients expressed things that stop them from being happy and ways they can improve their happiness. The group stated reasons why it is important to be happy. The group participated in the intervention "My Happiness", where they had a chance to identify and express things that make them happy. Clinical Observations/Feedback:  Patient came to group and defined happiness as the love of happiness. He identified his happiness as family, on time and smart. Participant expressed that drinking and smoking stops him from being happy. Individual explained you must be happy for yourself. Patient participated in the intervention and was social with staff.   Brion Sossamon LRT/CTRS          Roman Dubuc 06/12/2018 10:33 AM

## 2018-06-13 ENCOUNTER — Inpatient Hospital Stay: Payer: No Typology Code available for payment source

## 2018-06-13 MED ORDER — BENZTROPINE MESYLATE 1 MG PO TABS
0.5000 mg | ORAL_TABLET | Freq: Two times a day (BID) | ORAL | Status: DC
  Administered 2018-06-13 – 2018-06-19 (×12): 0.5 mg via ORAL
  Filled 2018-06-13 (×13): qty 1

## 2018-06-13 MED ORDER — DIPHENHYDRAMINE HCL 50 MG/ML IJ SOLN
INTRAMUSCULAR | Status: AC
  Administered 2018-06-13: 50 mg via INTRAMUSCULAR
  Filled 2018-06-13: qty 1

## 2018-06-13 MED ORDER — DIPHENHYDRAMINE HCL 25 MG PO CAPS: 50.0000 mg | ORAL_CAPSULE | Freq: Once | ORAL | Status: AC

## 2018-06-13 MED ORDER — DIPHENHYDRAMINE HCL 50 MG/ML IJ SOLN
50.0000 mg | Freq: Once | INTRAMUSCULAR | Status: AC
  Administered 2018-06-13: 50 mg via INTRAMUSCULAR

## 2018-06-13 MED ORDER — DIPHENHYDRAMINE HCL 50 MG/ML IJ SOLN: 50.0000 mg | Freq: Four times a day (QID) | INTRAMUSCULAR | Status: DC | PRN

## 2018-06-13 NOTE — [facility] Group Notes (Signed)
LCSW Group Therapy Note  06/13/2018 1:43 PM  Type of Therapy/Topic:  Group Therapy:  Emotion Regulation  Participation Level:  Active   Description of Group:   The purpose of this group is to assist patients in learning to regulate negative emotions and experience positive emotions. Patients will be guided to discuss ways in which they have been vulnerable to their negative emotions. These vulnerabilities will be juxtaposed with experiences of positive emotions or situations, and patients will be challenged to use positive emotions to combat negative ones. Special emphasis will be placed on coping with negative emotions in conflict situations, and patients will process healthy conflict resolution skills.  Therapeutic Goals: 1. Patient will identify two positive emotions or experiences to reflect on in order to balance out negative emotions 2. Patient will label two or more emotions that they find the most difficult to experience 3. Patient will demonstrate positive conflict resolution skills through discussion and/or role plays  Summary of Patient Progress: Pt was appropriate and respectful in group. Pt reported that he was feeling sad prior to coming to the hospital. Pt reported having to have patience, be humble, have morals, and be responsible as things he has been feeling since being in the hospital. Pts thoughts were a little disorganized, but pt talked in an appropriate tone.   Therapeutic Modalities:   Cognitive Behavioral Therapy Feelings Identification Dialectical Behavioral Therapy   Iris Pert, MSW, LCSW Clinical Social Work 06/13/2018 1:43 PM

## 2018-06-13 NOTE — Progress Notes (Signed)
Patient is currently asleep.  CT called and asked when patient can come down.  I informed them patient was asleep at this time. They advised staff to call [phone] when patient is ready to go up for scan.

## 2018-06-13 NOTE — Progress Notes (Signed)
Staff was called outside due to patient exhibiting symptoms.  MHT stated that patient was turning his head and his mouth was "turned funny."  Vitals were taken.  Patient had just been playing basketball.  His vitals were: 152/82; 99% room air; P 130.  Informed MD of symptoms.  He states he is in no physical distress.

## 2018-06-13 NOTE — Progress Notes (Signed)
Recreation Therapy Notes    Date: 06/13/2018  Time: 9:30 am  Location: Craft Room  Behavioral response: Appropriate, off-topic  Intervention Topic: Self-esteem  Discussion/Intervention:  Group content today was focused on self-esteem. Patient defined self-esteem and where it comes form. The group described reasons self-esteem is important. Individuals stated things that impact self-esteem and positive ways to improve self-esteem. The group participated in the intervention "Collage of Me" where patients were able to create a collage of positive things that makes them who they are. Clinical Observations/Feedback:  Patient came to group and defined self-esteem as positive people. He explained that staying around positive people can help self esteem. Individual then went on to read out of his journal stating he will will be positive and humble to get his daughter out of foster care. Chris [name] LRT/CTRS         [name] 06/13/2018 11:29 AM

## 2018-06-13 NOTE — Progress Notes (Signed)
MD saw patient in room.  Vital retaken.  BP remains on the high side.  He states he is not in any physical distress.  He is drooling while drinking a gatorade. MD aware of symptoms.

## 2018-06-13 NOTE — Progress Notes (Signed)
Patient was observed stiffening his neck and turning it sideways.  He continues to drool.  Patient was given 50 mg IM left deltoid.  Patient tolerated the injection well. He is currently resting quietly in his room.

## 2018-06-13 NOTE — Progress Notes (Signed)
[facility] MD Progress Note  06/13/2018 4:06 PM Devon Cozza  MRN:  [id number] Subjective: Follow-up for this patient with a psychotic disorder and substance abuse problem.  Patient seen chart reviewed.  On interview the patient has no complaints.  He denies any symptoms at all.  He even denies having any feeling of discomfort or any symptoms despite what having what looks very much like a dystonic reaction.  Denies suicidal or homicidal ideation.  He interacts a little bit with peers but does not talk very much.  No aggressive or bizarre behavior.  Basically taking care of his ADLs adequately.  Only medication he had was his injection of Invega.  Was not getting any standing oral antipsychotics. Principal Problem: Schizoaffective disorder, bipolar type (HCC) Diagnosis: Principal Problem:   Schizoaffective disorder, bipolar type (HCC)  Total Time spent with patient: 30 minutes  Past Psychiatric History: Patient has a history of psychotic disorder and substance abuse  Past Medical History:  Past Medical History:  Diagnosis Date  . Anxiety   . Bipolar 2 disorder (HCC)   . Depression   . Schizophrenia (HCC)    History reviewed. No pertinent surgical history. Family History:  Family History  Problem Relation Age of Onset  . Bipolar disorder Other   . Schizophrenia Other    Family Psychiatric  History: Unknown Social History:  Social History   Substance and Sexual Activity  Alcohol Use Yes  . Alcohol/week: 2.0 standard drinks  . Types: 2 Standard drinks or equivalent per week     Social History   Substance and Sexual Activity  Drug Use Yes  . Types: Marijuana    Social History   Socioeconomic History  . Marital status: Single    Spouse name: Not on file  . Number of children: Not on file  . Years of education: Not on file  . Highest education level: Not on file  Occupational History  . Not on file  Social Needs  . Financial resource strain: Not on file  . Food insecurity:     Worry: Not on file    Inability: Not on file  . Transportation needs:    Medical: Not on file    Non-medical: Not on file  Tobacco Use  . Smoking status: Current Every Day Smoker    Packs/day: 0.50  . Smokeless tobacco: Current User  Substance and Sexual Activity  . Alcohol use: Yes    Alcohol/week: 2.0 standard drinks    Types: 2 Standard drinks or equivalent per week  . Drug use: Yes    Types: Marijuana  . Sexual activity: Yes  Lifestyle  . Physical activity:    Days per week: Not on file    Minutes per session: Not on file  . Stress: Not on file  Relationships  . Social connections:    Talks on phone: Not on file    Gets together: Not on file    Attends religious service: Not on file    Active member of club or organization: Not on file    Attends meetings of clubs or organizations: Not on file    Relationship status: Not on file  Other Topics Concern  . Not on file  Social History Narrative  . Not on file   Additional Social History:                         Sleep: Fair  Appetite:  Fair  Current Medications: Current Facility-Administered Medications  Medication Dose Route Frequency Provider Last Rate Last Dose  . acetaminophen (TYLENOL) tablet 650 mg  650 mg Oral Q6H PRN Mariel Craft, MD      . alum & mag hydroxide-simeth (MAALOX/MYLANTA) 200-200-20 MG/5ML suspension 30 mL  30 mL Oral Q4H PRN Mariel Craft, MD      . benztropine (COGENTIN) tablet 0.5 mg  0.5 mg Oral BID Sandrina Heaton T, MD      . diphenhydrAMINE (BENADRYL) capsule 50 mg  50 mg Oral QHS PRN Mariel Craft, MD   50 mg at 06/07/18 0813  . diphenhydrAMINE (BENADRYL) injection 50 mg  50 mg Intramuscular Q6H PRN Shalay Carder T, MD      . hydrOXYzine (ATARAX/VISTARIL) tablet 25 mg  25 mg Oral Q6H PRN Mariel Craft, MD      . ibuprofen (ADVIL,MOTRIN) tablet 400 mg  400 mg Oral BID PRN Mariel Craft, MD      . Influenza vac split quadrivalent PF (FLUARIX) injection 0.5 mL   0.5 mL Intramuscular Tomorrow-1000 Pucilowska, Jolanta B, MD      . magnesium hydroxide (MILK OF MAGNESIA) suspension 30 mL  30 mL Oral Daily PRN Mariel Craft, MD      . nicotine (NICODERM CQ - dosed in mg/24 hours) patch 21 mg  21 mg Transdermal Daily Pucilowska, Jolanta B, MD   21 mg at 06/13/18 0824  . paliperidone (INVEGA SUSTENNA) injection 234 mg  234 mg Intramuscular Q28 days Pucilowska, Jolanta B, MD   234 mg at 06/06/18 1601  . temazepam (RESTORIL) capsule 30 mg  30 mg Oral QHS Pucilowska, Jolanta B, MD   30 mg at 06/12/18 2155    Lab Results: No results found for this or any previous visit (from the past 48 hour(s)).  Blood Alcohol level:  Lab Results  Component Value Date   ETH <10 06/05/2018   ETH 67 (H) 09/26/2017    Metabolic Disorder Labs: Lab Results  Component Value Date   HGBA1C 5.6 06/05/2018   MPG 114.02 06/05/2018   MPG 94 12/01/2015   Lab Results  Component Value Date   PROLACTIN 58.4 (H) 12/01/2015   Lab Results  Component Value Date   CHOL 111 06/05/2018   TRIG 66 06/05/2018   HDL 38 (L) 06/05/2018   CHOLHDL 2.9 06/05/2018   VLDL 13 06/05/2018   LDLCALC 60 06/05/2018   LDLCALC 50 12/01/2015    Physical Findings: AIMS: Facial and Oral Movements Muscles of Facial Expression: None, normal Lips and Perioral Area: None, normal Jaw: None, normal Tongue: None, normal,Extremity Movements Upper (arms, wrists, hands, fingers): None, normal Lower (legs, knees, ankles, toes): None, normal, Trunk Movements Neck, shoulders, hips: None, normal, Overall Severity Severity of abnormal movements (highest score from questions above): None, normal Incapacitation due to abnormal movements: None, normal Patient's awareness of abnormal movements (rate only patient's report): No Awareness, Dental Status Current problems with teeth and/or dentures?: No Does patient usually wear dentures?: No  CIWA:    COWS:     Musculoskeletal: Strength & Muscle Tone: within  normal limits Gait & Station: normal Patient leans: N/A  Psychiatric Specialty Exam: Physical Exam  Nursing note and vitals reviewed. Constitutional: He appears well-developed and well-nourished.  HENT:  Head: Normocephalic and atraumatic.  Eyes: Pupils are equal, round, and reactive to light. Conjunctivae are normal.  Neck: Normal range of motion.  Cardiovascular: Regular rhythm and normal heart sounds.  Respiratory: Effort normal. No respiratory distress.  GI: Soft.  Musculoskeletal: Normal range of motion.  Neurological: He is alert.  Patient was exhibiting on several occasions this afternoon jerking in his neck that appeared to be a dystonic reaction.  Also was having involuntary drooling which suggested some change in motor function in his oral area  Skin: Skin is warm and dry.  Psychiatric: His affect is blunt. His speech is delayed. He is slowed. Thought content is not paranoid. Cognition and memory are impaired. He expresses inappropriate judgment. He expresses no homicidal and no suicidal ideation.    Review of Systems  Constitutional: Negative.   HENT: Negative.   Eyes: Negative.   Respiratory: Negative.   Cardiovascular: Negative.   Gastrointestinal: Negative.   Musculoskeletal: Negative.   Skin: Negative.   Neurological: Negative.   Psychiatric/Behavioral: Negative.     Blood pressure (!) 152/82, pulse (!) 130, temperature 98.3 F (36.8 C), temperature source Oral, resp. rate 20, height 5' 11.65" (1.82 m), weight 79.4 kg, SpO2 99 %.Body mass index is 23.96 kg/m.  General Appearance: Fairly Groomed  Eye Contact:  Fair  Speech:  Slow  Volume:  Decreased  Mood:  Euthymic  Affect:  Congruent  Thought Process:  Coherent  Orientation:  Full (Time, Place, and Person)  Thought Content:  Concrete.  Little speech.  Suicidal Thoughts:  No  Homicidal Thoughts:  No  Memory:  Immediate;   Fair Recent;   Fair Remote;   Fair  Judgement:  Impaired  Insight:  Shallow   Psychomotor Activity:  Decreased and As noted above the patient this afternoon it showed evidence on several occasions of what looked like dystonic reaction.  Concentration:  Concentration: Fair  Recall:  Poor  Fund of Knowledge:  Fair  Language:  Fair  Akathisia:  No  Handed:  Right  AIMS (if indicated):     Assets:  Physical Health Resilience  ADL's:  Intact  Cognition:  Impaired,  Mild  Sleep:  Number of Hours: 6.3     Treatment Plan Summary: Daily contact with patient to assess and evaluate symptoms and progress in treatment, Medication management and Plan Patient still seems to be flat and blunted although does not today appear to be responding to internal stimuli.  Denies any dangerous thoughts.  He did have these odd displays this afternoon which look like they could be dystonic reactions although when I did a physical exam I could not appreciate any cogwheeling.  We will give him a shot of Benadryl for this but I am also going to order a head CT just to make sure we are not missing some other neurologic issue.  Nursing is aware to keep an eye on him although he does not seem to be unsteady.  Not adding any other psychiatric medicine right now.  We are still working on trying to find appropriate discharge  Mordecai Rasmussen, MD 06/13/2018, 4:06 PM

## 2018-06-13 NOTE — Plan of Care (Signed)
D: Patient's speech is coherent and logical.  He requested a shave this morning.  His hygiene has improved.  He appears restless, walking the halls at times.  He remains vague regarding his discharge plan.  He denies any thoughts of self harm.  He denies any AVH.  He denies any depressive symptoms.  His affect is flat, blunted; his mood is stable.  A: Continue to monitor medication management and MD orders.  Safety checks completed every 15 minutes per protocol.  Offer support and encouragement as needed.  R: Patient is receptive to staff; his behavior is appropriate.    Problem: Education: Goal: Knowledge of [facility] Education information/materials will improve Outcome: Progressing   Problem: Coping: Goal: Ability to verbalize frustrations and anger appropriately will improve Outcome: Progressing   Problem: Education: Goal: Knowledge of [facility] Education information/materials will improve Outcome: Progressing Goal: Emotional status will improve Outcome: Progressing Goal: Mental status will improve Outcome: Progressing

## 2018-06-14 NOTE — Plan of Care (Signed)
Pt pleasant on approach, spent most of shift in bed resting on shift. He was compliant with medication regime. No behavioral issues to report on shift at this time.

## 2018-06-14 NOTE — Progress Notes (Signed)
D - Patient was in his room upon arrival to the unit. Patient was pleasant during assessment and medication administration. Patient denies SI/HI/AVH, pain, anxiety and depression with this Clinical research associate. Patient was take to CT for a scan this evening.   A - Patient was compliant with medication administration per MD orders. (See MAR, patient did refuse one medication) Patient compliant with procedures on the unit. Patient given education. Patient given support and encouragement to be active in his treatment plan. Patient informed to let staff know if there are any issues or problems on the unit.   R - Patient being monitored Q 15 minutes for safety per unit protocol. Patient remains safe on the unit at this time.

## 2018-06-14 NOTE — Progress Notes (Signed)
[facility] MD Progress Note  06/14/2018 11:43 AM Chris [name]  MRN:  [id number] Subjective: Follow-up for this patient with a psychotic disorder and substance abuse problem.  Patient seen chart reviewed.  Patient was seen in his room standing up he denies any dystonic reaction today, he did agree that he had some muscle spasm in his neck but that is better and does not bother him now and he did not ask why he had this could recur etc.  He denies any problems with the medications states his staff are taking care of him well.  He is not able to explain why he is here or how he got here or what the future plans for him are.  He denies suicidal ideation denies hallucinations although I am not sure he would recognize hallucinations if he is having them.  Demographics listed as homeless in [place] however he claims that he lives with his mother in [place].  However he states he does not know if she is gotten his letter yet he claims that he wrote her letter 2 days ago so "she should get the letter today."  He also thinks that he has been at [facility] or will go back to [facility].  Thought still disorganized.  Call to mother Chris [name]: No answer.  Principal Problem: Schizoaffective disorder, bipolar type (HCC) Diagnosis: Principal Problem:   Schizoaffective disorder, bipolar type (HCC)  Total Time spent with patient: 30 minutes  Past Psychiatric History: Patient has a history of psychotic disorder and substance abuse  Past Medical History:  Past Medical History:  Diagnosis Date  . Anxiety   . Bipolar 2 disorder (HCC)   . Depression   . Schizophrenia (HCC)    History reviewed. No pertinent surgical history. Family History:  Family History  Problem Relation Age of Onset  . Bipolar disorder Other   . Schizophrenia Other    Family Psychiatric  History: Unknown Social History:  Social History   Substance and Sexual Activity  Alcohol Use Yes  . Alcohol/week: 2.0 standard drinks  . Types: 2  Standard drinks or equivalent per week     Social History   Substance and Sexual Activity  Drug Use Yes  . Types: Marijuana    Social History   Socioeconomic History  . Marital status: Single    Spouse name: Not on file  . Number of children: Not on file  . Years of education: Not on file  . Highest education level: Not on file  Occupational History  . Not on file  Social Needs  . Financial resource strain: Not on file  . Food insecurity:    Worry: Not on file    Inability: Not on file  . Transportation needs:    Medical: Not on file    Non-medical: Not on file  Tobacco Use  . Smoking status: Current Every Day Smoker    Packs/day: 0.50  . Smokeless tobacco: Current User  Substance and Sexual Activity  . Alcohol use: Yes    Alcohol/week: 2.0 standard drinks    Types: 2 Standard drinks or equivalent per week  . Drug use: Yes    Types: Marijuana  . Sexual activity: Yes  Lifestyle  . Physical activity:    Days per week: Not on file    Minutes per session: Not on file  . Stress: Not on file  Relationships  . Social connections:    Talks on phone: Not on file    Gets together: Not on  file    Attends religious service: Not on file    Active member of club or organization: Not on file    Attends meetings of clubs or organizations: Not on file    Relationship status: Not on file  Other Topics Concern  . Not on file  Social History Narrative  . Not on file   Additional Social History:                         Sleep: Fair  Appetite:  Fair  Current Medications: Current Facility-Administered Medications  Medication Dose Route Frequency Provider Last Rate Last Dose  . acetaminophen (TYLENOL) tablet 650 mg  650 mg Oral Q6H PRN Mariel Craft, MD      . alum & mag hydroxide-simeth (MAALOX/MYLANTA) 200-200-20 MG/5ML suspension 30 mL  30 mL Oral Q4H PRN Mariel Craft, MD      . benztropine (COGENTIN) tablet 0.5 mg  0.5 mg Oral BID Clapacs, Jackquline Denmark, MD    0.5 mg at 06/14/18 0807  . diphenhydrAMINE (BENADRYL) capsule 50 mg  50 mg Oral QHS PRN Mariel Craft, MD   50 mg at 06/07/18 0813  . diphenhydrAMINE (BENADRYL) injection 50 mg  50 mg Intramuscular Q6H PRN Clapacs, John T, MD      . hydrOXYzine (ATARAX/VISTARIL) tablet 25 mg  25 mg Oral Q6H PRN Mariel Craft, MD      . ibuprofen (ADVIL,MOTRIN) tablet 400 mg  400 mg Oral BID PRN Mariel Craft, MD      . Influenza vac split quadrivalent PF (FLUARIX) injection 0.5 mL  0.5 mL Intramuscular Tomorrow-1000 Pucilowska, Jolanta B, MD      . magnesium hydroxide (MILK OF MAGNESIA) suspension 30 mL  30 mL Oral Daily PRN Mariel Craft, MD      . nicotine (NICODERM CQ - dosed in mg/24 hours) patch 21 mg  21 mg Transdermal Daily Pucilowska, Jolanta B, MD   21 mg at 06/14/18 0808  . paliperidone (INVEGA SUSTENNA) injection 234 mg  234 mg Intramuscular Q28 days Pucilowska, Jolanta B, MD   234 mg at 06/06/18 1601  . temazepam (RESTORIL) capsule 30 mg  30 mg Oral QHS Pucilowska, Jolanta B, MD   30 mg at 06/12/18 2155    Lab Results: No results found for this or any previous visit (from the past 48 hour(s)).  Blood Alcohol level:  Lab Results  Component Value Date   ETH <10 06/05/2018   ETH 67 (H) 09/26/2017    Metabolic Disorder Labs: Lab Results  Component Value Date   HGBA1C 5.6 06/05/2018   MPG 114.02 06/05/2018   MPG 94 12/01/2015   Lab Results  Component Value Date   PROLACTIN 58.4 (H) 12/01/2015   Lab Results  Component Value Date   CHOL 111 06/05/2018   TRIG 66 06/05/2018   HDL 38 (L) 06/05/2018   CHOLHDL 2.9 06/05/2018   VLDL 13 06/05/2018   LDLCALC 60 06/05/2018   LDLCALC 50 12/01/2015    Physical Findings: AIMS: Facial and Oral Movements Muscles of Facial Expression: None, normal Lips and Perioral Area: None, normal Jaw: None, normal Tongue: None, normal,Extremity Movements Upper (arms, wrists, hands, fingers): None, normal Lower (legs, knees, ankles, toes): None,  normal, Trunk Movements Neck, shoulders, hips: None, normal, Overall Severity Severity of abnormal movements (highest score from questions above): None, normal Incapacitation due to abnormal movements: None, normal Patient's awareness of abnormal movements (rate only patient's report):  No Awareness, Dental Status Current problems with teeth and/or dentures?: No Does patient usually wear dentures?: No  CIWA:    COWS:     Musculoskeletal: Strength & Muscle Tone: within normal limits Gait & Station: normal Patient leans: N/A  Psychiatric Specialty Exam: Physical Exam  Nursing note and vitals reviewed. Constitutional: He appears well-developed and well-nourished.  HENT:  Head: Normocephalic and atraumatic.  Eyes: Pupils are equal, round, and reactive to light. Conjunctivae are normal.  Neck: Normal range of motion.  Cardiovascular: Regular rhythm and normal heart sounds.  Respiratory: Effort normal. No respiratory distress.  GI: Soft.  Musculoskeletal: Normal range of motion.  Neurological: He is alert.  Patient was exhibiting on several occasions this afternoon jerking in his neck that appeared to be a dystonic reaction.  Also was having involuntary drooling which suggested some change in motor function in his oral area  Skin: Skin is warm and dry.  Psychiatric: His affect is blunt. His speech is delayed. He is slowed. Thought content is not paranoid. Cognition and memory are impaired. He expresses inappropriate judgment. He expresses no homicidal and no suicidal ideation.    Review of Systems  Constitutional: Negative.   HENT: Negative.   Eyes: Negative.   Respiratory: Negative.   Cardiovascular: Negative.   Gastrointestinal: Negative.   Musculoskeletal: Negative.   Skin: Negative.   Neurological: Negative.   Psychiatric/Behavioral: Negative.     Blood pressure 127/64, pulse 81, temperature (!) 97.5 F (36.4 C), temperature source Oral, resp. rate 18, height 5' 11.65" (1.82  m), weight 79.4 kg, SpO2 100 %.Body mass index is 23.96 kg/m.  General Appearance: Fairly Groomed  Eye Contact:  Fair  Speech:  Slow  Volume:  Decreased  Mood:  Euthymic  Affect:  Congruent  Thought Process:  Coherent  Orientation:  Full (Time, Place, and Person)  Thought Content:  Concrete.  Little speech.  Suicidal Thoughts:  No  Homicidal Thoughts:  No  Memory:  Immediate;   Fair Recent;   Fair Remote;   Fair  Judgement:  Impaired  Insight:  Shallow  Psychomotor Activity:  Decreased and As noted above the patient this afternoon it showed evidence on several occasions of what looked like dystonic reaction.  Concentration:  Concentration: Fair  Recall:  Poor  Fund of Knowledge:  Fair  Language:  Fair  Akathisia:  No  Handed:  Right  AIMS (if indicated):     Assets:  Physical Health Resilience  ADL's:  Intact  Cognition:  Impaired,  Mild  Sleep:  Number of Hours: 6.25     Treatment Plan Summary: Daily contact with patient to assess and evaluate symptoms and progress in treatment, Medication management and Plan Continue current medication regimen monitor for EPS/dystonia but none apparent today.  He does have some psychomotor retardation may be some mild EPS and/or psychomotor retardation associated with schizophrenia.  Discharge planning in place unclear disposition at this time.  He claims he lives with his mother although he also states she does not know where he is.  Terance Hart, MD 06/14/2018, 11:43 AM

## 2018-06-14 NOTE — Plan of Care (Signed)
Patient stated that he is feeling better and better every day. He is hopeful he will be getting out of here soon.   Problem: Education: Goal: Emotional status will improve Outcome: Progressing Goal: Mental status will improve Outcome: Progressing

## 2018-06-14 NOTE — Progress Notes (Signed)
Recreation Therapy Notes   Date: 06/14/2018  Time: 9:30 am  Location: Craft Room  Behavioral response: Appropriate  Intervention Topic: Goals   Discussion/Intervention:  Group content on today was focused on goals. Patients described what goals are and how they define goals. Individuals expressed how they go about setting goals and reaching them. The group identified how important goals are and if they make short term goals to reach long term goals. Patients described how many goals they work on at a time and what affects them not reaching their goal. Individuals described how much time they put into planning and obtaining their goals. The group participated in the intervention "My Goal Board" and made personal goal boards to help them achieve their goal. Clinical Observations/Feedback:  Patient came to group and defined goals as remaining focused, humble and being patient. He stated he set goals by praying and deep breathing. Individual explained that people do not reach their goals because you are your worst enemy. Patient participated in the intervention during group.  Natina Wiginton LRT/CTRS          Sinead Hockman 06/14/2018 10:42 AM

## 2018-06-14 NOTE — [facility] Group Notes (Signed)
LCSW Group Therapy Note  06/14/2018 1:00 PM  Type of Therapy/Topic:  Group Therapy:  Balance in Life  Participation Level:  Active  Description of Group:    This group will address the concept of balance and how it feels and looks when one is unbalanced. Patients will be encouraged to process areas in their lives that are out of balance and identify reasons for remaining unbalanced. Facilitators will guide patients in utilizing problem-solving interventions to address and correct the stressor making their life unbalanced. Understanding and applying boundaries will be explored and addressed for obtaining and maintaining a balanced life. Patients will be encouraged to explore ways to assertively make their unbalanced needs known to significant others in their lives, using other group members and facilitator for support and feedback.  Therapeutic Goals: 1. Patient will identify two or more emotions or situations they have that consume much of in their lives. 2. Patient will identify signs/triggers that life has become out of balance:  3. Patient will identify two ways to set boundaries in order to achieve balance in their lives:  4. Patient will demonstrate ability to communicate their needs through discussion and/or role plays  Summary of Patient Progress: Patient was present and attentive in group.  Patient was supportive of other group members. Patient did express some disorganized thinking, however, was able to be redirected.  While patient did not engage in discussion on how he feels unbalanced, however, did engage in discussion on how to encourage balance and seek balance.    Therapeutic Modalities:   Cognitive Behavioral Therapy Solution-Focused Therapy Assertiveness Training  Penni Homans MSW, LCSW 06/14/2018 1:39 PM

## 2018-06-14 NOTE — Plan of Care (Signed)
Patient is pleasant, but remains disorganized in thoughts. Today patient was mailing himself pictures, and letters. Patient denies SI,HI and AVH. Patient participates in groups and is appropriate on the unit . Patient dresses appropriately and bathes. No signs of ESP thus far today. Patient did take cogentin.  Problem: Education: Goal: Knowledge of [place] [facility] Education information/materials will improve Outcome: Progressing   Problem: Safety: Goal: Periods of time without injury will increase Outcome: Progressing   Problem: Education: Goal: Knowledge of [place] [facility] Education information/materials will improve Outcome: Progressing Goal: Emotional status will improve Outcome: Progressing Goal: Mental status will improve Outcome: Progressing Goal: Verbalization of understanding the information provided will improve Outcome: Progressing   Problem: Activity: Goal: Interest or engagement in activities will improve Outcome: Progressing Goal: Sleeping patterns will improve Outcome: Progressing

## 2018-06-15 MED ORDER — TEMAZEPAM 15 MG PO CAPS
15.0000 mg | ORAL_CAPSULE | Freq: Every day | ORAL | Status: DC
  Administered 2018-06-15 – 2018-06-18 (×4): 15 mg via ORAL
  Filled 2018-06-15 (×4): qty 1

## 2018-06-15 MED ORDER — TRAZODONE HCL 100 MG PO TABS: 100.0000 mg | ORAL_TABLET | Freq: Every evening | ORAL | Status: DC | PRN

## 2018-06-15 NOTE — Progress Notes (Signed)
Recreation Therapy Notes  Date: 06/15/2018  Time: 9:30 am  Location: Craft Room  Behavioral response: Appropriate  Intervention Topic: Relaxation  Discussion/Intervention:  Group content today was focused on relaxation. The group defined relaxation and identified healthy ways to relax. Individuals expressed how much time they spend relaxing. Patients expressed how much their life would be if they did not make time for themselves to relax. The group stated ways they could improve their relaxation techniques in the future.  Individuals participated in the intervention "Time to Relax" where they had a chance to experience different relaxation techniques.  Clinical Observations/Feedback:  Patient came to group and defined relaxation as listening to music while doing creative things. He identified deep breathing, reading and journaling as activates he participates in to relax. Participant expressed that things would be stressful if he did not have time to relax. Patient participated in the intervention during group.  Gleb Mcguire LRT/CTRS         Leslie Jester 06/15/2018 11:51 AM

## 2018-06-15 NOTE — [facility] Group Notes (Signed)
Feelings Around Relapse 06/15/2018 1PM  Type of Therapy and Topic:  Group Therapy:  Feelings around Relapse and Recovery  Participation Level:  Active   Description of Group:    Patients in this group will discuss emotions they experience before and after a relapse. They will process how experiencing these feelings, or avoidance of experiencing them, relates to having a relapse. Facilitator will guide patients to explore emotions they have related to recovery. Patients will be encouraged to process which emotions are more powerful. They will be guided to discuss the emotional reaction significant others in their lives may have to patients' relapse or recovery. Patients will be assisted in exploring ways to respond to the emotions of others without this contributing to a relapse.  Therapeutic Goals: 1. Patient will identify two or more emotions that lead to a relapse for them 2. Patient will identify two emotions that result when they relapse 3. Patient will identify two emotions related to recovery 4. Patient will demonstrate ability to communicate their needs through discussion and/or role plays   Summary of Patient Progress:  Actively and appropriately engaged in the group. Patient was able to provide support and validation to other group members.Patient practiced active listening when interacting with the facilitator and other group members. Patient discussed with group healthy coping skills to avoid relapse.   Therapeutic Modalities:   Cognitive Behavioral Therapy Solution-Focused Therapy Assertiveness Training Relapse Prevention Therapy   Suzan Slick, LCSW 06/15/2018 2:13 PM

## 2018-06-15 NOTE — Tx Team (Signed)
Interdisciplinary Treatment and Diagnostic Plan Update  06/15/2018 Time of Session: 830am Aseel Uhde MRN: [id number]  Principal Diagnosis: Schizoaffective disorder, bipolar type [facility])  Secondary Diagnoses: Principal Problem:   Schizoaffective disorder, bipolar type ([place])   Current Medications:  Current Facility-Administered Medications  Medication Dose Route Frequency Provider Last Rate Last Dose  . acetaminophen (TYLENOL) tablet 650 mg  650 mg Oral Q6H PRN Lavella Hammock, MD      . alum & mag hydroxide-simeth (MAALOX/MYLANTA) 200-200-20 MG/5ML suspension 30 mL  30 mL Oral Q4H PRN Lavella Hammock, MD      . benztropine (COGENTIN) tablet 0.5 mg  0.5 mg Oral BID Clapacs, Madie Reno, MD   0.5 mg at 06/15/18 0807  . diphenhydrAMINE (BENADRYL) capsule 50 mg  50 mg Oral QHS PRN Lavella Hammock, MD   50 mg at 06/07/18 0813  . diphenhydrAMINE (BENADRYL) injection 50 mg  50 mg Intramuscular Q6H PRN Clapacs, John T, MD      . hydrOXYzine (ATARAX/VISTARIL) tablet 25 mg  25 mg Oral Q6H PRN Lavella Hammock, MD      . ibuprofen (ADVIL,MOTRIN) tablet 400 mg  400 mg Oral BID PRN Lavella Hammock, MD      . Influenza vac split quadrivalent PF (FLUARIX) injection 0.5 mL  0.5 mL Intramuscular Tomorrow-1000 Pucilowska, Jolanta B, MD      . magnesium hydroxide (MILK OF MAGNESIA) suspension 30 mL  30 mL Oral Daily PRN Lavella Hammock, MD      . nicotine (NICODERM CQ - dosed in mg/24 hours) patch 21 mg  21 mg Transdermal Daily Pucilowska, Jolanta B, MD   21 mg at 06/15/18 0809  . paliperidone (INVEGA SUSTENNA) injection 234 mg  234 mg Intramuscular Q28 days Pucilowska, Jolanta B, MD   234 mg at 06/06/18 1601  . temazepam (RESTORIL) capsule 30 mg  30 mg Oral QHS Pucilowska, Jolanta B, MD   30 mg at 06/14/18 2203   PTA Medications: Medications Prior to Admission  Medication Sig Dispense Refill Last Dose  . ibuprofen (ADVIL,MOTRIN) 200 MG tablet Take 400 mg by mouth 2 (two) times daily as needed for  moderate pain.   Past Week at Unknown time    Patient Stressors: Financial difficulties Loss of Homeless Medication change or noncompliance Substance abuse  Patient Strengths: Ability for insight Average or above average intelligence Capable of independent living  Treatment Modalities: Medication Management, Group therapy, Case management,  1 to 1 session with clinician, Psychoeducation, Recreational therapy.   Physician Treatment Plan for Primary Diagnosis: Schizoaffective disorder, bipolar type ([employer]) Long Term Goal(s): Improvement in symptoms so as ready for discharge Improvement in symptoms so as ready for discharge   Short Term Goals: Ability to identify changes in lifestyle to reduce recurrence of condition will improve Ability to verbalize feelings will improve Ability to disclose and discuss suicidal ideas Ability to demonstrate self-control will improve Ability to identify and develop effective coping behaviors will improve Ability to maintain clinical measurements within normal limits will improve Compliance with prescribed medications will improve Ability to identify triggers associated with substance abuse/mental health issues will improve NA  Medication Management: Evaluate patient's response, side effects, and tolerance of medication regimen.  Therapeutic Interventions: 1 to 1 sessions, Unit Group sessions and Medication administration.  Evaluation of Outcomes: Not Met  Physician Treatment Plan for Secondary Diagnosis: Principal Problem:   Schizoaffective disorder, bipolar type ([place])  Long Term Goal(s): Improvement in symptoms so as ready for discharge Improvement in  symptoms so as ready for discharge   Short Term Goals: Ability to identify changes in lifestyle to reduce recurrence of condition will improve Ability to verbalize feelings will improve Ability to disclose and discuss suicidal ideas Ability to demonstrate self-control will improve Ability to  identify and develop effective coping behaviors will improve Ability to maintain clinical measurements within normal limits will improve Compliance with prescribed medications will improve Ability to identify triggers associated with substance abuse/mental health issues will improve NA     Medication Management: Evaluate patient's response, side effects, and tolerance of medication regimen.  Therapeutic Interventions: 1 to 1 sessions, Unit Group sessions and Medication administration.  Evaluation of Outcomes: Not Met   RN Treatment Plan for Primary Diagnosis: Schizoaffective disorder, bipolar type ([place]) Long Term Goal(s): Knowledge of disease and therapeutic regimen to maintain health will improve  Short Term Goals: Ability to demonstrate self-control, Ability to participate in decision making will improve, Ability to identify and develop effective coping behaviors will improve and Compliance with prescribed medications will improve  Medication Management: RN will administer medications as ordered by provider, will assess and evaluate patient's response and provide education to patient for prescribed medication. RN will report any adverse and/or side effects to prescribing provider.  Therapeutic Interventions: 1 on 1 counseling sessions, Psychoeducation, Medication administration, Evaluate responses to treatment, Monitor vital signs and CBGs as ordered, Perform/monitor CIWA, COWS, AIMS and Fall Risk screenings as ordered, Perform wound care treatments as ordered.  Evaluation of Outcomes: Not Met   LCSW Treatment Plan for Primary Diagnosis: Schizoaffective disorder, bipolar type ([place]) Long Term Goal(s): Safe transition to appropriate next level of care at discharge, Engage patient in therapeutic group addressing interpersonal concerns.  Short Term Goals: Engage patient in aftercare planning with referrals and resources  Therapeutic Interventions: Assess for all discharge needs, 1 to 1  time with Social worker, Explore available resources and support systems, Assess for adequacy in community support network, Educate family and significant other(s) on suicide prevention, Complete Psychosocial Assessment, Interpersonal group therapy.  Evaluation of Outcomes: Not Met   Progress in Treatment: Attending groups: Yes. Participating in groups: Yes. Taking medication as prescribed: Yes. Toleration medication: Yes. Family/Significant other contact made: No, will contact:  pt still presents in a disorganized manner, unclear of his support system Patient understands diagnosis: No. Discussing patient identified problems/goals with staff: Yes. Medical problems stabilized or resolved: No. Denies suicidal/homicidal ideation: Yes. Issues/concerns per patient self-inventory: No. Other: NA  New problem(s) identified: No, Describe:  none reported  New Short Term/Long Term Goal(s): "Get my daughter back in my life. Remain positive, remain humble"  Patient Goals: Get my daughter back in my life. Remain positive, remain humble"  Discharge Plan or Barriers: Pt is homeless, no income, unsure about support system, unsure about legal history. Discharge plan is tbd. 06/11/18-Pt has court date 3/23 in [place] presents with disorganized thinking and paranoia, unable to participate in discharge planning at this time. D/C plan is TBD. 06/15/18-Pt presents in a disorganized manner, unsure of support system. No discharge plan established at this time.  Reason for Continuation of Hospitalization: Medication stabilization  Estimated Length of Stay: TBD  Recreational Therapy: Patient Stressors: N/A Patient Goal: Patient will focus on task/topic with 2 prompts from staff within 5 recreation therapy group sessions  Attendees: Patient: 06/15/2018 10:26 AM  Physician: Alethia Berthold 06/15/2018 10:26 AM  Nursing: Jerry Caras 06/15/2018 10:26 AM  RN Care Manager: 06/15/2018 10:26 AM  Social Worker:  Sanjuana Kava  LCSW [facility] LCSW Ethel Rana LCSW 06/15/2018 10:26 AM  Recreational Therapist:  06/15/2018 10:26 AM  Other:  06/15/2018 10:26 AM  Other:  06/15/2018 10:26 AM  Other: 06/15/2018 10:26 AM    Scribe for Treatment Team: Yvette Rack, LCSW 06/15/2018 10:26 AM

## 2018-06-15 NOTE — Progress Notes (Signed)
Chris [name]  06/15/2018 3:04 PM Chris [name]  MRN:  [id number] Subjective: Follow-up for this patient with a psychotic disorder and substance abuse problem.  Patient seen chart reviewed.  The patient is actually a bit more interactive today.  He was able to produce a phone number for someone who he states is a relative and social work is trying to contact this individual.  Chris [name] outside to play basketball.  He states he was discharged from [place] with a box of clothing and they gave him a bus ticket to [place] when he got out he had describes taking the box of belongings from [place] and shows the shirt he has on now is an item they gave him at [place].  He also states he has a court date on March 23 for misdemeanor larceny, he states is actually a total of 3 charges and he thinks the court date is in [place].  He states that he has been using crack cocaine smoking and alcohol and marijuana.  He states he does not use other drugs including opioids and crystal meth.  He still disorganized but much more information today than yesterday and social work staff are exploring this additional information to see if there is family support available.  Continue current medication, taper temazepam to 15 mg nightly to try to get him off the benzodiazepine.  Trazodone 100 mg nightly as needed sleep.  Principal Problem: Schizoaffective disorder, bipolar type (H[facility]) Diagnosis: Principal Problem:   Schizoaffective disorder, bipolar type ([facility])  Total Time spent with patient: 20 minutes  Past Psychiatric History: Patient has a history of psychotic disorder and substance abuse  Past Medical History:  Past Medical History:  Diagnosis Date  . Anxiety   . Bipolar 2 disorder ([facility])   . Depression   . Schizophrenia ([facility])    History reviewed. No pertinent surgical history. Family History:  Family History  Problem Relation Age of Onset  . Bipolar disorder Other   . Schizophrenia Other    Family Psychiatric   History: Unknown Social History:  Social History   Substance and Sexual Activity  Alcohol Use Yes  . Alcohol/week: 2.0 standard drinks  . Types: 2 Standard drinks or equivalent per week     Social History   Substance and Sexual Activity  Drug Use Yes  . Types: Marijuana    Social History   Socioeconomic History  . Marital status: Single    Spouse name: Not on file  . Number of children: Not on file  . Years of education: Not on file  . Highest education level: Not on file  Occupational History  . Not on file  Social Needs  . Financial resource strain: Not on file  . Food insecurity:    Worry: Not on file    Inability: Not on file  . Transportation needs:    Medical: Not on file    Non-medical: Not on file  Tobacco Use  . Smoking status: Current Every Day Smoker    Packs/day: 0.50  . Smokeless tobacco: Current User  Substance and Sexual Activity  . Alcohol use: Yes    Alcohol/week: 2.0 standard drinks    Types: 2 Standard drinks or equivalent per week  . Drug use: Yes    Types: Marijuana  . Sexual activity: Yes  Lifestyle  . Physical activity:    Days per week: Not on file    Minutes per session: Not on file  . Stress: Not on file  Relationships  .  Social connections:    Talks on phone: Not on file    Gets together: Not on file    Attends religious service: Not on file    Active member of club or organization: Not on file    Attends meetings of clubs or organizations: Not on file    Relationship status: Not on file  Other Topics Concern  . Not on file  Social History Narrative  . Not on file   Additional Social History:                         Sleep: Fair  Appetite:  Fair  Current Medications: Current Facility-Administered Medications  Medication Dose Route Frequency Provider Last Rate Last Dose  . acetaminophen (TYLENOL) tablet 650 mg  650 mg Oral Q6H PRN Mariel Craft, MD      . alum & mag hydroxide-simeth (MAALOX/MYLANTA)  200-200-20 MG/5ML suspension 30 mL  30 mL Oral Q4H PRN Mariel Craft, MD      . benztropine (COGENTIN) tablet 0.5 mg  0.5 mg Oral BID Clapacs, Jackquline Denmark, MD   0.5 mg at 06/15/18 0807  . diphenhydrAMINE (BENADRYL) capsule 50 mg  50 mg Oral QHS PRN Mariel Craft, MD   50 mg at 06/07/18 0813  . diphenhydrAMINE (BENADRYL) injection 50 mg  50 mg Intramuscular Q6H PRN Clapacs, John T, MD      . hydrOXYzine (ATARAX/VISTARIL) tablet 25 mg  25 mg Oral Q6H PRN Mariel Craft, MD      . ibuprofen (ADVIL,MOTRIN) tablet 400 mg  400 mg Oral BID PRN Mariel Craft, MD      . Influenza vac split quadrivalent PF (FLUARIX) injection 0.5 mL  0.5 mL Intramuscular Tomorrow-1000 Pucilowska, Jolanta B, MD      . magnesium hydroxide (MILK OF MAGNESIA) suspension 30 mL  30 mL Oral Daily PRN Mariel Craft, MD      . nicotine (NICODERM CQ - dosed in mg/24 hours) patch 21 mg  21 mg Transdermal Daily Pucilowska, Jolanta B, MD   21 mg at 06/15/18 0809  . paliperidone (INVEGA SUSTENNA) injection 234 mg  234 mg Intramuscular Q28 days Pucilowska, Jolanta B, MD   234 mg at 06/06/18 1601  . temazepam (RESTORIL) capsule 30 mg  30 mg Oral QHS Pucilowska, Jolanta B, MD   30 mg at 06/14/18 2203    Lab Results: No results found for this or any previous visit (from the past 48 hour(s)).  Blood Alcohol level:  Lab Results  Component Value Date   ETH <10 06/05/2018   ETH 67 (H) 09/26/2017    Metabolic Disorder Labs: Lab Results  Component Value Date   HGBA1C 5.6 06/05/2018   MPG 114.02 06/05/2018   MPG 94 12/01/2015   Lab Results  Component Value Date   PROLACTIN 58.4 (H) 12/01/2015   Lab Results  Component Value Date   CHOL 111 06/05/2018   TRIG 66 06/05/2018   HDL 38 (L) 06/05/2018   CHOLHDL 2.9 06/05/2018   VLDL 13 06/05/2018   LDLCALC 60 06/05/2018   LDLCALC 50 12/01/2015    Physical Findings: AIMS: Facial and Oral Movements Muscles of Facial Expression: None, normal Lips and Perioral Area: None,  normal Jaw: None, normal Tongue: None, normal,Extremity Movements Upper (arms, wrists, hands, fingers): None, normal Lower (legs, knees, ankles, toes): None, normal, Trunk Movements Neck, shoulders, hips: None, normal, Overall Severity Severity of abnormal movements (highest score from questions above):  None, normal Incapacitation due to abnormal movements: None, normal Patient's awareness of abnormal movements (rate only patient's report): No Awareness, Dental Status Current problems with teeth and/or dentures?: No Does patient usually wear dentures?: No  CIWA:    COWS:     Musculoskeletal: Strength & Muscle Tone: within normal limits Gait & Station: normal Patient leans: N/A  Psychiatric Specialty Exam: Physical Exam  Nursing [name] and vitals reviewed. Constitutional: He appears well-developed and well-nourished.  HENT:  Head: Normocephalic and atraumatic.  Eyes: Pupils are equal, round, and reactive to light. Conjunctivae are normal.  Neck: Normal range of motion.  Cardiovascular: Regular rhythm and normal heart sounds.  Respiratory: Effort normal. No respiratory distress.  GI: Soft.  Musculoskeletal: Normal range of motion.  Neurological: He is alert.  Patient was exhibiting on several occasions this afternoon jerking in his neck that appeared to be a dystonic reaction.  Also was having involuntary drooling which suggested some change in motor function in his oral area  Skin: Skin is warm and dry.  Psychiatric: His affect is blunt. His speech is delayed. He is slowed. Thought content is not paranoid. Cognition and memory are impaired. He expresses inappropriate judgment. He expresses no homicidal and no suicidal ideation.    Review of Systems  Constitutional: Negative.   HENT: Negative.   Eyes: Negative.   Respiratory: Negative.   Cardiovascular: Negative.   Gastrointestinal: Negative.   Musculoskeletal: Negative.   Skin: Negative.   Neurological: Negative.    Psychiatric/Behavioral: Negative.     Blood pressure 107/68, pulse 87, temperature (!) 97.5 F (36.4 C), temperature source Oral, resp. rate 18, height 5' 11.65" (1.82 m), weight 79.4 kg, SpO2 100 %.Body mass index is 23.96 kg/m.  General Appearance: Fairly Groomed  Eye Contact:  Fair  Speech:  Slow  Volume:  Decreased  Mood:  Euthymic  Affect:  Congruent  Thought Process:  Coherent  Orientation:  Full (Time, Place, and Person)  Thought Content:  Concrete.  Little speech.  Suicidal Thoughts:  No  Homicidal Thoughts:  No  Memory:  Immediate;   Fair Recent;   Fair Remote;   Fair  Judgement:  Impaired  Insight:  Shallow  Psychomotor Activity:  Decreased and As noted above the patient this afternoon it showed evidence on several occasions of what looked like dystonic reaction.  Concentration:  Concentration: Fair  Recall:  Poor  Fund of Knowledge:  Fair  Language:  Fair  Akathisia:  No  Handed:  Right  AIMS (if indicated):     Assets:  Physical Health Resilience  ADL's:  Intact  Cognition:  Impaired,  Mild  Sleep:  Number of Hours: 7.5     Treatment Plan Summary: Daily contact with patient to assess and evaluate symptoms and progress in treatment, Medication management and Plan EPS is less apparent today.  Taper Restoril to 15 mg nightly, taper off the benzodiazepine, trazodone 100 mg nightly as needed sleep.  Exploring options for outpatient discharge and management hopefully there is some family support available.  Terance Hart, MD 06/15/2018, 3:04 PM

## 2018-06-15 NOTE — Plan of Care (Signed)
Patient is alert an oriented X 3, denies SI, HI and AVH. Patient is pleasant and cooperative, taking medications appropriately. Patient seems a little more clear than yesterday. Patient denies pain 0/10. Patient is eating and sleeping well. No self injurious behaviors observed.

## 2018-06-16 NOTE — [facility] Group Notes (Signed)
LCSW Group Therapy Note   06/16/2018 1:15pm   Type of Therapy and Topic:  Group Therapy:  Trust and Honesty  Participation Level:  Active  Description of Group:    In this group patients will be asked to explore the value of being honest.  Patients will be guided to discuss their thoughts, feelings, and behaviors related to honesty and trusting in others. Patients will process together how trust and honesty relate to forming relationships with peers, family members, and self. Each patient will be challenged to identify and express feelings of being vulnerable. Patients will discuss reasons why people are dishonest and identify alternative outcomes if one was truthful (to self or others). This group will be process-oriented, with patients participating in exploration of their own experiences, giving and receiving support, and processing challenge from other group members.   Therapeutic Goals: 1. Patient will identify why honesty is important to relationships and how honesty overall affects relationships.  2. Patient will identify a situation where they lied or were lied too and the  feelings, thought process, and behaviors surrounding the situation 3. Patient will identify the meaning of being vulnerable, how that feels, and how that correlates to being honest with self and others. 4. Patient will identify situations where they could have told the truth, but instead lied and explain reasons of dishonesty.   Summary of Patient ProgressThe patient was able to explore the value of being honest.  Patient discussed thoughts, feelings, and behaviors related to honesty and trusting in others. The patient processed together with other group members how trust and honesty relate to forming relationships with peers, family members, and self. Pt actively and appropriately engaged in the group. Patient was able to provide support and validation to other group members. Patient practiced active listening when  interacting with the facilitator and other group members.    Therapeutic Modalities:   Cognitive Behavioral Therapy Solution Focused Therapy Motivational Interviewing Brief Therapy  Wynton Hufstetler  CUEBAS-COLON, LCSW 06/16/2018 12:58 PM

## 2018-06-16 NOTE — Progress Notes (Signed)
D: Patient is more interactive with staff and patients. Denies SI, HI and AV hallucinations. Mood is pleasant. Affect is brighter. Had EKG done. Is asking questions about discharge and saying that he needs clothes because he does not have any and wants to know if social work can provide him with donated clothing. Denies any complaints of pain. In no acute distress. A: Continue to monitor for safety and offer support. R: Safety maintained.

## 2018-06-16 NOTE — Progress Notes (Signed)
D: Patient has been more attention seeking and needy. Taking oral medication. Denies SI, HI and AV hallucinations. Mood and affect are appropriate. A: Continue to monitor for safety and offer support. R: Safety maintained 

## 2018-06-16 NOTE — Plan of Care (Signed)
D: Patient is more interactive with staff and patients. Denies SI, HI and AV hallucinations. Mood is pleasant. Affect is brighter. Had EKG done. Is asking questions about discharge and saying that he needs clothes because he does not have any and wants to know if social work can provide him with donated clothing. Denies any complaints of pain. In no acute distress. A: Continue to monitor for safety and offer support. R: Safety maintained. 

## 2018-06-16 NOTE — Plan of Care (Signed)
D: Patient has been more attention seeking and needy. Taking oral medication. Denies SI, HI and AV hallucinations. Mood and affect are appropriate. A: Continue to monitor for safety and offer support. R: Safety maintained

## 2018-06-16 NOTE — Progress Notes (Addendum)
[facility] MD Progress Note  06/16/2018 3:00 PM Chris [name]  MRN:  [id number]    Subjective: Thoughts are little more organized today compared to yesterday.  He says he generally wants to stay clean from drugs and has been sober and clean for several months now.  He does not want to return to [facility] where his family is because he has relatives there that use drugs.  He would like to go to residential substance abuse treatment.  He currently denies any active or passive suicidal thoughts.  He is denying any auditory or visual hallucinations.  He has been attending groups and is outside playing basketball today with peers.  No behavioral disturbances.  He regrets prior actions when using drugs and is now facing misdemeanor larceny charges.  He feels that if he returns to drugs, he will be in jail.  He denies any somatic complaints.  No vital signs taken this morning.  He slept well last night.  Past psychiatric history. There are two prior admission at [facility]. Diagnosed bipolar in 2014 discharge on Seroquel and Tegretol. In 2017 diagnosed with paranoid schizophrenia discharged on Trileptal and Invega sustenna injection.   Family history. Unknown.   Social history. Homeless and uninsured. He has a daughter who is in foster care somewhere and is now determined to get custody back. He did not complete high school, no GEDs.   Principal Problem: Schizoaffective disorder, bipolar type (HCC) Diagnosis: Principal Problem:   Schizoaffective disorder, bipolar type (HCC)  Total Time spent with patient: 20 minutes   Past Medical History:  Past Medical History:  Diagnosis Date  . Anxiety   . Bipolar 2 disorder (HCC)   . Depression   . Schizophrenia (HCC)    History reviewed. No pertinent surgical history. Family History:  Family History  Problem Relation Age of Onset  . Bipolar disorder Other   . Schizophrenia Other    Family Psychiatric  History: Unknown Social History:  Social History    Substance and Sexual Activity  Alcohol Use Yes  . Alcohol/week: 2.0 standard drinks  . Types: 2 Standard drinks or equivalent per week     Social History   Substance and Sexual Activity  Drug Use Yes  . Types: Marijuana    Social History   Socioeconomic History  . Marital status: Single    Spouse name: Not on file  . Number of children: Not on file  . Years of education: Not on file  . Highest education level: Not on file  Occupational History  . Not on file  Social Needs  . Financial resource strain: Not on file  . Food insecurity:    Worry: Not on file    Inability: Not on file  . Transportation needs:    Medical: Not on file    Non-medical: Not on file  Tobacco Use  . Smoking status: Current Every Day Smoker    Packs/day: 0.50  . Smokeless tobacco: Current User  Substance and Sexual Activity  . Alcohol use: Yes    Alcohol/week: 2.0 standard drinks    Types: 2 Standard drinks or equivalent per week  . Drug use: Yes    Types: Marijuana  . Sexual activity: Yes  Lifestyle  . Physical activity:    Days per week: Not on file    Minutes per session: Not on file  . Stress: Not on file  Relationships  . Social connections:    Talks on phone: Not on file    Gets  together: Not on file    Attends religious service: Not on file    Active member of club or organization: Not on file    Attends meetings of clubs or organizations: Not on file    Relationship status: Not on file  Other Topics Concern  . Not on file  Social History Narrative  . Not on file      Sleep: Fair  Appetite:  Fair  Current Medications: Current Facility-Administered Medications  Medication Dose Route Frequency Provider Last Rate Last Dose  . acetaminophen (TYLENOL) tablet 650 mg  650 mg Oral Q6H PRN Mariel Craft, MD      . alum & mag hydroxide-simeth (MAALOX/MYLANTA) 200-200-20 MG/5ML suspension 30 mL  30 mL Oral Q4H PRN Mariel Craft, MD      . benztropine (COGENTIN) tablet 0.5  mg  0.5 mg Oral BID Clapacs, Jackquline Denmark, MD   0.5 mg at 06/16/18 0805  . diphenhydrAMINE (BENADRYL) capsule 50 mg  50 mg Oral QHS PRN Mariel Craft, MD   50 mg at 06/07/18 0813  . diphenhydrAMINE (BENADRYL) injection 50 mg  50 mg Intramuscular Q6H PRN Clapacs, John T, MD      . hydrOXYzine (ATARAX/VISTARIL) tablet 25 mg  25 mg Oral Q6H PRN Mariel Craft, MD      . ibuprofen (ADVIL,MOTRIN) tablet 400 mg  400 mg Oral BID PRN Mariel Craft, MD      . Influenza vac split quadrivalent PF (FLUARIX) injection 0.5 mL  0.5 mL Intramuscular Tomorrow-1000 Pucilowska, Jolanta B, MD      . magnesium hydroxide (MILK OF MAGNESIA) suspension 30 mL  30 mL Oral Daily PRN Mariel Craft, MD      . nicotine (NICODERM CQ - dosed in mg/24 hours) patch 21 mg  21 mg Transdermal Daily Pucilowska, Jolanta B, MD   21 mg at 06/16/18 0804  . paliperidone (INVEGA SUSTENNA) injection 234 mg  234 mg Intramuscular Q28 days Pucilowska, Jolanta B, MD   234 mg at 06/06/18 1601  . temazepam (RESTORIL) capsule 15 mg  15 mg Oral QHS Terance Hart, MD   15 mg at 06/15/18 2104  . traZODone (DESYREL) tablet 100 mg  100 mg Oral QHS PRN Terance Hart, MD        Lab Results: No results found for this or any previous visit (from the past 48 hour(s)).  Blood Alcohol level:  Lab Results  Component Value Date   ETH <10 06/05/2018   ETH 67 (H) 09/26/2017    Metabolic Disorder Labs: Lab Results  Component Value Date   HGBA1C 5.6 06/05/2018   MPG 114.02 06/05/2018   MPG 94 12/01/2015   Lab Results  Component Value Date   PROLACTIN 58.4 (H) 12/01/2015   Lab Results  Component Value Date   CHOL 111 06/05/2018   TRIG 66 06/05/2018   HDL 38 (L) 06/05/2018   CHOLHDL 2.9 06/05/2018   VLDL 13 06/05/2018   LDLCALC 60 06/05/2018   LDLCALC 50 12/01/2015    Physical Findings: AIMS: Facial and Oral Movements Muscles of Facial Expression: None, normal Lips and Perioral Area: None, normal Jaw: None, normal Tongue:  None, normal,Extremity Movements Upper (arms, wrists, hands, fingers): None, normal Lower (legs, knees, ankles, toes): None, normal, Trunk Movements Neck, shoulders, hips: None, normal, Overall Severity Severity of abnormal movements (highest score from questions above): None, normal Incapacitation due to abnormal movements: None, normal Patient's awareness of abnormal movements (rate only patient's report):  No Awareness, Dental Status Current problems with teeth and/or dentures?: No Does patient usually wear dentures?: No  CIWA:    COWS:     Musculoskeletal: Strength & Muscle Tone: within normal limites Gait & Station: normal Patient leans: N/A  Psychiatric Specialty Exam: Physical Exam  Nursing note and vitals reviewed.   Review of Systems  Constitutional: Negative.   HENT: Negative.   Eyes: Negative.   Respiratory: Negative.   Cardiovascular: Negative.   Gastrointestinal: Negative.   Musculoskeletal: Negative.   Skin: Negative.   Neurological: Negative.     Blood pressure 107/68, pulse 87, temperature (!) 97.5 F (36.4 C), temperature source Oral, resp. rate 18, height 5' 11.65" (1.82 m), weight 79.4 kg, SpO2 100 %.Body mass index is 23.96 kg/m.  General Appearance: Casual  Eye Contact:  Fair  Speech: Regular rate and rhythm  Volume:  Normal  Mood:  "I am OK"  Affect:  Restricted  Thought Process:  Tangential  Orientation:  Full (Time, Place, and Person)  Thought Content:  Logical - no paranoid or delusional thoughts  Suicidal Thoughts:  No  Homicidal Thoughts:  No  Memory:  Immediate;   Fair Recent;   Fair Remote;   Fair  Judgement:  Impaired  Insight:  Shallow  Psychomotor Activity:  Normal  Concentration:  Concentration: Fair and Attention Span: Fair  Recall:  Poor  Fund of Knowledge:  Fair  Language:  Fair  Akathisia:  No  Handed:  Right  AIMS (if indicated):     Assets:  Physical Health Resilience  ADL's:  Intact  Cognition:  Impaired,  Mild   Sleep:  Number of Hours: 8     Treatment Plan Summary:  Mr. Theesfeld is a 33 year old male with a history of bipolar disodre admitted for manic episode in the context of treatment noncomplinace.   Schizaoffective Disorder, Bipolar Type -He will be maintained on Invega Sustenna 234 mg monthly and Cogentin 0.5 mg p.o. twice daily for EPS -We will continue temazepam 15 mg p.o. nightly insomnia.  He also has trazodone 100 mg p.o. nightly as needed for insomnia -Hemoglobin A1c was 5.6 and total cholesterol was 111.  Prolactin was elevated in the 50s -EKG did not show any QTC prolongation  #Cocaine use disorder and cannabis use disorder formation: -The patient has been sober for several months. -He was advised to abstain from marijuana, cocaine and illicit drugs that may worsen mood symptoms -He was encouraged to continue in a meaningful recovery program at the time of discharge  #Tobacco use disorder -He was offered a nicotine patch  #Disposition -Patient is requesting residential substance abuse treatment.  He is currently homeless [facility].     Darliss Ridgel, MD 06/16/2018, 3:00 PM

## 2018-06-16 NOTE — Plan of Care (Signed)
Patient is alert and oriented X 4, denies SI, HI and AVH. Patient is very pleasant and cooperative on the unit with peers and staff. Patient is very euphoric, patient state"I need to just slow down sometimes. Patient talked to RN about leaving [facility] and being left outside to catch a bus that never came. Patient is very cooperative and really enjoys groups. No self harming observations at this time. Problem: Education: Goal: Knowledge of [place] [facility] Education information/materials will improve Outcome: Progressing   Problem: Coping: Goal: Ability to verbalize frustrations and anger appropriately will improve Outcome: Progressing Goal: Ability to demonstrate self-control will improve Outcome: Progressing   Problem: Safety: Goal: Periods of time without injury will increase Outcome: Progressing   Problem: Education: Goal: Knowledge of [place] [facility] Education information/materials will improve Outcome: Progressing Goal: Emotional status will improve Outcome: Progressing Goal: Mental status will improve Outcome: Progressing Goal: Verbalization of understanding the information provided will improve Outcome: Progressing

## 2018-06-17 NOTE — Plan of Care (Signed)
Patient  redirect on [facility] Education. Emotional and mental status  improved  Patient able to verbalize understanding of information  received . Limited involvement  with attending unit programing   voice no concerns around sleep . Voice of no safety concerns  Thought process remain altered . Compliant  with medications . Emotional and mental status  improved . Problem: Education: Goal: Knowledge of [place] [facility] Education information/materials will improve Outcome: Progressing   Problem: Education: Goal: Knowledge of [place] [facility] Education information/materials will improve Outcome: Progressing Goal: Emotional status will improve Outcome: Progressing Goal: Mental status will improve Outcome: Progressing

## 2018-06-17 NOTE — Progress Notes (Signed)
Affect cheerful on approach  Limited interaction with peers and staff. Compliant  With medication  Given  Appetite good and  Voice no concerns around sleep. Patient did not attend unit programing  Denies suicidal ideation  No indications  With anger outburst Denies  auditory hallucinations  But responding  No pain concerns . Appropriate ADL'S. Interacting with peers and staff.  A: Encourage patient participation with unit programming . Instruction  Given on  Medication , verbalize understanding. R: Voice no other concerns. Staff continue to monitor

## 2018-06-17 NOTE — [facility] Group Notes (Signed)
LCSW Group Therapy Note 06/17/2018 1:15pm  Type of Therapy and Topic: Group Therapy: Feelings Around Returning Home & Establishing a Supportive Framework and Supporting Oneself When Supports Not Available  Participation Level: Active  Description of Group:  Patients first processed thoughts and feelings about upcoming discharge. These included fears of upcoming changes, lack of change, new living environments, judgements and expectations from others and overall stigma of mental health issues. The group then discussed the definition of a supportive framework, what that looks and feels like, and how do to discern it from an unhealthy non-supportive network. The group identified different types of supports as well as what to do when your family/friends are less than helpful or unavailable  Therapeutic Goals  1. Patient will identify one healthy supportive network that they can use at discharge. 2. Patient will identify one factor of a supportive framework and how to tell it from an unhealthy network. 3. Patient able to identify one coping skill to use when they do not have positive supports from others. 4. Patient will demonstrate ability to communicate their needs through discussion and/or role plays.  Summary of Patient Progress:  The patient reported he feels "good today." Pt engaged during group session. As patients processed their anxiety about discharge and described healthy supports patient shared he is ready to be discharge because he has a support system.  Patients identified at least one self-care tool they were willing to use after discharge.   Therapeutic Modalities Cognitive Behavioral Therapy Motivational Interviewing   Tsuyako Jolley  CUEBAS-COLON, LCSW 06/17/2018 9:36 AM

## 2018-06-17 NOTE — Progress Notes (Signed)
[facility] MD Progress Note  06/17/2018 7:41 AM Chris [name]  MRN:  [id number]    Subjective: Thoughts are more organized today and he is speaking in a very logical fashion.  The patient endorsed short-term goals of wanting to get a car and get his GED at [employer].  He also has a long-term goal of wanting to get custody of his daughter back.  He does not want to be around family [facility] because they use drugs.  He says he is committed to staying clean from alcohol and drugs.  He denies current active or passive suicidal thoughts or homicidal thoughts and says his mood is much better.  He denies any auditory or visual hallucinations.  No paranoid thoughts or delusions.  He has been interacting well with staff and peers and is calm on the unit.  He was playing basketball with peers yesterday.  The patient denies any somatic complaints.  Vital signs stable this morning.  He slept over 7 hours last night.  Appetite is good.  Supportive psychotherapy provided and time for and encouraging the patient to try and be more mindful of triggers for substance use and to use coping skills when feeling the urge to use.  Past psychiatric history. There are two prior admission at [facility]. Diagnosed bipolar in 2014 discharge on Seroquel and Tegretol. In 2017 diagnosed with paranoid schizophrenia discharged on Trileptal and Invega sustenna injection.   Family history.  He says his mother uses drugs.  Social history. Homeless and uninsured. He has a daughter who is in foster care somewhere and is now determined to get custody back. He did not complete high school, no GEDs.   Principal Problem: Schizoaffective disorder, bipolar type (HCC) Diagnosis: Principal Problem:   Schizoaffective disorder, bipolar type (HCC)  Total Time spent with patient: 20 minutes   Past Medical History:  Past Medical History:  Diagnosis Date  . Anxiety   . Bipolar 2 disorder (HCC)   . Depression   .  Schizophrenia (HCC)    History reviewed. No pertinent surgical history. Family History:  Family History  Problem Relation Age of Onset  . Bipolar disorder Other   . Schizophrenia Other    Family Psychiatric  History: Unknown Social History:  Social History   Substance and Sexual Activity  Alcohol Use Yes  . Alcohol/week: 2.0 standard drinks  . Types: 2 Standard drinks or equivalent per week     Social History   Substance and Sexual Activity  Drug Use Yes  . Types: Marijuana    Social History   Socioeconomic History  . Marital status: Single    Spouse name: Not on file  . Number of children: Not on file  . Years of education: Not on file  . Highest education level: Not on file  Occupational History  . Not on file  Social Needs  . Financial resource strain: Not on file  . Food insecurity:    Worry: Not on file    Inability: Not on file  . Transportation needs:    Medical: Not on file    Non-medical: Not on file  Tobacco Use  . Smoking status: Current Every Day Smoker    Packs/day: 0.50  . Smokeless tobacco: Current User  Substance and Sexual Activity  . Alcohol use: Yes    Alcohol/week: 2.0 standard drinks    Types: 2 Standard drinks or equivalent per week  . Drug use: Yes    Types: Marijuana  . Sexual activity:  Yes  Lifestyle  . Physical activity:    Days per week: Not on file    Minutes per session: Not on file  . Stress: Not on file  Relationships  . Social connections:    Talks on phone: Not on file    Gets together: Not on file    Attends religious service: Not on file    Active member of club or organization: Not on file    Attends meetings of clubs or organizations: Not on file    Relationship status: Not on file  Other Topics Concern  . Not on file  Social History Narrative  . Not on file      Sleep: Fair  Appetite:  Fair  Current Medications: Current Facility-Administered Medications  Medication Dose Route Frequency Provider Last  Rate Last Dose  . acetaminophen (TYLENOL) tablet 650 mg  650 mg Oral Q6H PRN Mariel Craft, MD      . alum & mag hydroxide-simeth (MAALOX/MYLANTA) 200-200-20 MG/5ML suspension 30 mL  30 mL Oral Q4H PRN Mariel Craft, MD      . benztropine (COGENTIN) tablet 0.5 mg  0.5 mg Oral BID Clapacs, John T, MD   0.5 mg at 06/16/18 1640  . diphenhydrAMINE (BENADRYL) capsule 50 mg  50 mg Oral QHS PRN Mariel Craft, MD   50 mg at 06/07/18 0813  . diphenhydrAMINE (BENADRYL) injection 50 mg  50 mg Intramuscular Q6H PRN Clapacs, John T, MD      . hydrOXYzine (ATARAX/VISTARIL) tablet 25 mg  25 mg Oral Q6H PRN Mariel Craft, MD      . ibuprofen (ADVIL,MOTRIN) tablet 400 mg  400 mg Oral BID PRN Mariel Craft, MD      . Influenza vac split quadrivalent PF (FLUARIX) injection 0.5 mL  0.5 mL Intramuscular Tomorrow-1000 Pucilowska, Jolanta B, MD      . magnesium hydroxide (MILK OF MAGNESIA) suspension 30 mL  30 mL Oral Daily PRN Mariel Craft, MD      . nicotine (NICODERM CQ - dosed in mg/24 hours) patch 21 mg  21 mg Transdermal Daily Pucilowska, Jolanta B, MD   21 mg at 06/16/18 0804  . paliperidone (INVEGA SUSTENNA) injection 234 mg  234 mg Intramuscular Q28 days Pucilowska, Jolanta B, MD   234 mg at 06/06/18 1601  . temazepam (RESTORIL) capsule 15 mg  15 mg Oral QHS Terance Hart, MD   15 mg at 06/16/18 2057  . traZODone (DESYREL) tablet 100 mg  100 mg Oral QHS PRN Terance Hart, MD        Lab Results: No results found for this or any previous visit (from the past 48 hour(s)).  Blood Alcohol level:  Lab Results  Component Value Date   ETH <10 06/05/2018   ETH 67 (H) 09/26/2017    Metabolic Disorder Labs: Lab Results  Component Value Date   HGBA1C 5.6 06/05/2018   MPG 114.02 06/05/2018   MPG 94 12/01/2015   Lab Results  Component Value Date   PROLACTIN 58.4 (H) 12/01/2015   Lab Results  Component Value Date   CHOL 111 06/05/2018   TRIG 66 06/05/2018   HDL 38 (L)  06/05/2018   CHOLHDL 2.9 06/05/2018   VLDL 13 06/05/2018   LDLCALC 60 06/05/2018   LDLCALC 50 12/01/2015    Physical Findings: AIMS: Facial and Oral Movements Muscles of Facial Expression: None, normal Lips and Perioral Area: None, normal Jaw: None, normal Tongue: None, normal,Extremity Movements  Upper (arms, wrists, hands, fingers): None, normal Lower (legs, knees, ankles, toes): None, normal, Trunk Movements Neck, shoulders, hips: None, normal, Overall Severity Severity of abnormal movements (highest score from questions above): None, normal Incapacitation due to abnormal movements: None, normal Patient's awareness of abnormal movements (rate only patient's report): No Awareness, Dental Status Current problems with teeth and/or dentures?: No Does patient usually wear dentures?: No   Musculoskeletal: Strength & Muscle Tone: within normal limites Gait & Station: normal Patient leans: N/A  Psychiatric Specialty Exam: Physical Exam  Nursing note and vitals reviewed.   Review of Systems  Constitutional: Negative.   HENT: Negative.   Eyes: Negative.   Respiratory: Negative.   Cardiovascular: Negative.   Gastrointestinal: Negative.   Musculoskeletal: Negative.   Skin: Negative.   Neurological: Negative.     Blood pressure 118/71, pulse 81, temperature 98.1 F (36.7 C), temperature source Oral, resp. rate 20, height 5' 11.65" (1.82 m), weight 79.4 kg, SpO2 100 %.Body mass index is 23.96 kg/m.  General Appearance:  Casual  Eye Contact:  Good  Speech: Regular rate and rhythm  Volume:  Normal  Mood:  "I am OK"  Affect:  Restricted  Thought Process:  More organized than yesterday  Orientation:  Full (Time, Place, and Person)  Thought Content:  No psychotic symptoms  Suicidal Thoughts:  Denies  Homicidal Thoughts:  Denies  Memory:  Immediate;   Good Recent;   Good Remote;   Good  Judgement:  Improving  Insight:  Fair  Psychomotor Activity:  Normal  Concentration:   Concentration: Good and Attention Span: Good  Recall:  [employer] of Knowledge:  Fair  Language:  Fair  Akathisia:  No  Handed:  Right  AIMS (if indicated):     Assets:  Physical Health Resilience  ADL's:  Intact  Cognition:  Impaired,  Mild  Sleep:  Number of Hours: 7.25     Treatment Plan Summary:  Mr. Westerfeld is a 33 year old male with a history of bipolar disodre admitted for manic episode in the context of treatment noncomplinace.   Schizaoffective Disorder, Bipolar Type The patient will be maintained on Invega Sustenna 234 mg monthly injection and Cogentin 0.5 mg p.o. twice daily for EPS He will continue temazepam 15 mg p.o. nightly for insomnia.  The patient also has trazodone 100 mg p.o. nightly as needed for insomnia Hemoglobin A1c was 5.6 and total cholesterol was 111.  Prolactin was elevated in the 50s. EKG did not show any QTC prolongation.  #Cocaine use disorder and cannabis use disorder disorder: He has been clean and sober for several months now and wants to be active and substance abuse treatment as an outpatient He was advised to abstain from alcohol and illicit drugs including marijuana, cocaine and opioids as they may worsen mood symptoms He was encouraged to continue her recovery program after discharge  #Tobacco use disorder He was offered a nicotine patch  #Disposition Patient is now wanting to go to [place] to [employer] after discharge.  He will need psychotropic medication management    Darliss Ridgel, MD 06/17/2018, 7:41 AM

## 2018-06-18 MED ORDER — BENZTROPINE MESYLATE 0.5 MG PO TABS: 0.5000 mg | ORAL_TABLET | Freq: Two times a day (BID) | ORAL | 1 refills | Status: AC

## 2018-06-18 MED ORDER — TEMAZEPAM 15 MG PO CAPS: 15.0000 mg | ORAL_CAPSULE | Freq: Every day | ORAL | 1 refills | Status: AC

## 2018-06-18 MED ORDER — BENZTROPINE MESYLATE 0.5 MG PO TABS: 0.5000 mg | ORAL_TABLET | Freq: Two times a day (BID) | ORAL | 0 refills | Status: DC

## 2018-06-18 MED ORDER — TRAZODONE HCL 100 MG PO TABS: 100.0000 mg | ORAL_TABLET | Freq: Every evening | ORAL | 0 refills | Status: DC | PRN

## 2018-06-18 MED ORDER — TRAZODONE HCL 100 MG PO TABS: 100.0000 mg | ORAL_TABLET | Freq: Every evening | ORAL | 1 refills | Status: AC | PRN

## 2018-06-18 MED ORDER — PALIPERIDONE PALMITATE ER 234 MG/1.5ML IM SUSY: 234.0000 mg | PREFILLED_SYRINGE | INTRAMUSCULAR | 2 refills | Status: AC

## 2018-06-18 NOTE — BHH Suicide Risk Assessment (Signed)
[facility] Discharge Suicide Risk Assessment   Principal Problem: Schizoaffective disorder, bipolar type [facility]) Discharge Diagnoses: Principal Problem:   Schizoaffective disorder, bipolar type (HCC)   Total Time spent with patient: 45 minutes  Musculoskeletal: Strength & Muscle Tone: within normal limits Gait & Station: normal Patient leans: N/A  Psychiatric Specialty Exam: Review of Systems  Constitutional: Negative.   HENT: Negative.   Eyes: Negative.   Respiratory: Negative.   Cardiovascular: Negative.   Gastrointestinal: Negative.   Musculoskeletal: Negative.   Skin: Negative.   Neurological: Negative.   Psychiatric/Behavioral: Negative.     Blood pressure 128/68, pulse 89, temperature 98 F (36.7 C), temperature source Oral, resp. rate 18, height 5' 11.65" (1.82 m), weight 79.4 kg, SpO2 100 %.Body mass index is 23.96 kg/m.  General Appearance: Fairly Groomed  Patent attorney::  Good  Speech:  Clear and Coherent409  Volume:  Normal  Mood:  Euthymic  Affect:  Appropriate  Thought Process:  Goal Directed  Orientation:  Full (Time, Place, and Person)  Thought Content:  Logical  Suicidal Thoughts:  No  Homicidal Thoughts:  No  Memory:  Immediate;   Fair Recent;   Fair Remote;   Fair  Judgement:  Fair  Insight:  Fair  Psychomotor Activity:  Normal  Concentration:  Fair  Recall:  [employer] of Knowledge:Fair  Language: Fair  Akathisia:  No  Handed:  Right  AIMS (if indicated):     Assets:  Desire for Improvement Physical Health  Sleep:  Number of Hours: 7.25  Cognition: WNL  ADL's:  Intact   Mental Status Per Nursing Assessment::   On Admission:  Self-harm behaviors  Demographic Factors:  Male, Living alone and Unemployed  Loss Factors: Financial problems/change in socioeconomic status  Historical Factors: Impulsivity  Risk Reduction Factors:   Religious beliefs about death, Positive social support and Positive therapeutic relationship  Continued Clinical  Symptoms:  Schizophrenia:   Paranoid or undifferentiated type  Cognitive Features That Contribute To Risk:  Loss of executive function    Suicide Risk:  Minimal: No identifiable suicidal ideation.  Patients presenting with no risk factors but with morbid ruminations; may be classified as minimal risk based on the severity of the depressive symptoms    Plan Of Care/Follow-up recommendations:  Activity:  Activity as tolerated Diet:  Regular diet Other:  Follow-up with substance abuse and mental health treatment  Mordecai Rasmussen, MD 06/18/2018, 4:59 PM

## 2018-06-18 NOTE — Progress Notes (Signed)
D: Patient stated slept good last night .Stated appetite is good and energy level  Is normal. Stated concentration is good . Stated on Depression scale 0 , hopeless 0and anxiety0 .( low 0-10 high) Denies suicidal  homicidal ideations  .  No auditory hallucinations  No pain concerns . Appropriate ADL'S. Interacting with peers and staff. Patiient  States he he is going to [facility]  In [place] . Patient styates he still has to go to court  3 misdemeanors. Affect cheerful on approach  Excited about  Going to new placement .  A: Encourage patient participation with unit programming . Instruction  Given on  Medication , verbalize understanding. R: Voice no other concerns. Staff continue to monitor
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
## 2018-06-18 NOTE — Progress Notes (Signed)
[facility] MD Progress Note  06/18/2018 4:56 PM Keyaan Koppel  MRN:  [id number] Subjective: Follow-up for this man with schizoaffective disorder and a history of drinking.  Patient reports he is feeling much better.  Denies hallucinations and denies paranoia.  Denies suicidal thoughts.  He is able to hold a lucid conversation.  Neatly taking care of his ADLs.  Shows good insight.  Patient is interested in discharge with a hope that he can follow-up with outpatient mental health and substance abuse treatment. Principal Problem: Schizoaffective disorder, bipolar type (HCC) Diagnosis: Principal Problem:   Schizoaffective disorder, bipolar type (HCC)  Total Time spent with patient: 30 minutes  Past Psychiatric History: History of schizophrenia and alcohol abuse.  Past Medical History:  Past Medical History:  Diagnosis Date  . Anxiety   . Bipolar 2 disorder (HCC)   . Depression   . Schizophrenia (HCC)    History reviewed. No pertinent surgical history. Family History:  Family History  Problem Relation Age of Onset  . Bipolar disorder Other   . Schizophrenia Other    Family Psychiatric  History: Positive Social History:  Social History   Substance and Sexual Activity  Alcohol Use Yes  . Alcohol/week: 2.0 standard drinks  . Types: 2 Standard drinks or equivalent per week     Social History   Substance and Sexual Activity  Drug Use Yes  . Types: Marijuana    Social History   Socioeconomic History  . Marital status: Single    Spouse name: Not on file  . Number of children: Not on file  . Years of education: Not on file  . Highest education level: Not on file  Occupational History  . Not on file  Social Needs  . Financial resource strain: Not on file  . Food insecurity:    Worry: Not on file    Inability: Not on file  . Transportation needs:    Medical: Not on file    Non-medical: Not on file  Tobacco Use  . Smoking status: Current Every Day Smoker    Packs/day: 0.50  .  Smokeless tobacco: Current User  Substance and Sexual Activity  . Alcohol use: Yes    Alcohol/week: 2.0 standard drinks    Types: 2 Standard drinks or equivalent per week  . Drug use: Yes    Types: Marijuana  . Sexual activity: Yes  Lifestyle  . Physical activity:    Days per week: Not on file    Minutes per session: Not on file  . Stress: Not on file  Relationships  . Social connections:    Talks on phone: Not on file    Gets together: Not on file    Attends religious service: Not on file    Active member of club or organization: Not on file    Attends meetings of clubs or organizations: Not on file    Relationship status: Not on file  Other Topics Concern  . Not on file  Social History Narrative  . Not on file   Additional Social History:                         Sleep: Fair  Appetite:  Fair  Current Medications: Current Facility-Administered Medications  Medication Dose Route Frequency Provider Last Rate Last Dose  . acetaminophen (TYLENOL) tablet 650 mg  650 mg Oral Q6H PRN Mariel Craft, MD      . alum & mag hydroxide-simeth (MAALOX/MYLANTA) 200-200-20 MG/5ML  suspension 30 mL  30 mL Oral Q4H PRN Mariel Craft, MD      . benztropine (COGENTIN) tablet 0.5 mg  0.5 mg Oral BID Clapacs, Jackquline Denmark, MD   0.5 mg at 06/18/18 0810  . diphenhydrAMINE (BENADRYL) capsule 50 mg  50 mg Oral QHS PRN Mariel Craft, MD   50 mg at 06/07/18 0813  . diphenhydrAMINE (BENADRYL) injection 50 mg  50 mg Intramuscular Q6H PRN Clapacs, John T, MD      . hydrOXYzine (ATARAX/VISTARIL) tablet 25 mg  25 mg Oral Q6H PRN Mariel Craft, MD      . ibuprofen (ADVIL,MOTRIN) tablet 400 mg  400 mg Oral BID PRN Mariel Craft, MD      . Influenza vac split quadrivalent PF (FLUARIX) injection 0.5 mL  0.5 mL Intramuscular Tomorrow-1000 Pucilowska, Jolanta B, MD      . magnesium hydroxide (MILK OF MAGNESIA) suspension 30 mL  30 mL Oral Daily PRN Mariel Craft, MD      . nicotine  (NICODERM CQ - dosed in mg/24 hours) patch 21 mg  21 mg Transdermal Daily Pucilowska, Jolanta B, MD   21 mg at 06/18/18 0810  . paliperidone (INVEGA SUSTENNA) injection 234 mg  234 mg Intramuscular Q28 days Pucilowska, Jolanta B, MD   234 mg at 06/06/18 1601  . temazepam (RESTORIL) capsule 15 mg  15 mg Oral QHS Terance Hart, MD   15 mg at 06/17/18 2106  . traZODone (DESYREL) tablet 100 mg  100 mg Oral QHS PRN Terance Hart, MD        Lab Results: No results found for this or any previous visit (from the past 48 hour(s)).  Blood Alcohol level:  Lab Results  Component Value Date   ETH <10 06/05/2018   ETH 67 (H) 09/26/2017    Metabolic Disorder Labs: Lab Results  Component Value Date   HGBA1C 5.6 06/05/2018   MPG 114.02 06/05/2018   MPG 94 12/01/2015   Lab Results  Component Value Date   PROLACTIN 58.4 (H) 12/01/2015   Lab Results  Component Value Date   CHOL 111 06/05/2018   TRIG 66 06/05/2018   HDL 38 (L) 06/05/2018   CHOLHDL 2.9 06/05/2018   VLDL 13 06/05/2018   LDLCALC 60 06/05/2018   LDLCALC 50 12/01/2015    Physical Findings: AIMS: Facial and Oral Movements Muscles of Facial Expression: None, normal Lips and Perioral Area: None, normal Jaw: None, normal Tongue: None, normal,Extremity Movements Upper (arms, wrists, hands, fingers): None, normal Lower (legs, knees, ankles, toes): None, normal, Trunk Movements Neck, shoulders, hips: None, normal, Overall Severity Severity of abnormal movements (highest score from questions above): None, normal Incapacitation due to abnormal movements: None, normal Patient's awareness of abnormal movements (rate only patient's report): No Awareness, Dental Status Current problems with teeth and/or dentures?: No Does patient usually wear dentures?: No  CIWA:    COWS:     Musculoskeletal: Strength & Muscle Tone: within normal limits Gait & Station: normal Patient leans: N/A  Psychiatric Specialty Exam: Physical  Exam  Nursing note and vitals reviewed. Constitutional: He appears well-developed and well-nourished.  HENT:  Head: Normocephalic and atraumatic.  Eyes: Pupils are equal, round, and reactive to light. Conjunctivae are normal.  Neck: Normal range of motion.  Cardiovascular: Regular rhythm and normal heart sounds.  Respiratory: Effort normal. No respiratory distress.  GI: Soft.  Musculoskeletal: Normal range of motion.  Neurological: He is alert.  Skin: Skin is warm  and dry.  Psychiatric: He has a normal mood and affect. His behavior is normal. Judgment and thought content normal.    Review of Systems  Constitutional: Negative.   HENT: Negative.   Eyes: Negative.   Respiratory: Negative.   Cardiovascular: Negative.   Gastrointestinal: Negative.   Musculoskeletal: Negative.   Skin: Negative.   Neurological: Negative.   Psychiatric/Behavioral: Negative.     Blood pressure 128/68, pulse 89, temperature 98 F (36.7 C), temperature source Oral, resp. rate 18, height 5' 11.65" (1.82 m), weight 79.4 kg, SpO2 100 %.Body mass index is 23.96 kg/m.  General Appearance: Fairly Groomed  Eye Contact:  Good  Speech:  Clear and Coherent  Volume:  Normal  Mood:  Euthymic  Affect:  Appropriate  Thought Process:  Goal Directed  Orientation:  Full (Time, Place, and Person)  Thought Content:  Logical  Suicidal Thoughts:  No  Homicidal Thoughts:  No  Memory:  Immediate;   Fair Recent;   Fair Remote;   Fair  Judgement:  Fair  Insight:  Fair  Psychomotor Activity:  Normal  Concentration:  Concentration: Fair  Recall:  [employer] of Knowledge:  Fair  Language:  Fair  Akathisia:  No  Handed:  Right  AIMS (if indicated):     Assets:  Desire for Improvement Physical Health  ADL's:  Intact  Cognition:  WNL  Sleep:  Number of Hours: 7.25     Treatment Plan Summary: Daily contact with patient to assess and evaluate symptoms and progress in treatment, Medication management and Plan  Preparations will be made for discharge tomorrow.  7-day supply can be provided.  Patient does not currently have an ID.  We will have to see if there is any way to find a shelter or place where he can stay without something like that or if there is any way he can obtain it.  Patient agreeable to plan.  Mordecai Rasmussen, MD 06/18/2018, 4:56 PM

## 2018-06-18 NOTE — Plan of Care (Signed)
Patient  redirect on [facility] Education. Emotional and mental status  improved  Patient able to verbalize understanding of information  received . Limited involvement  with attending unit programing   voice no concerns around sleep . Voice of no safety concerns  Thought process remain altered . Compliant  with medications . Emotional and mental status  improved . Problem: Education: Goal: Knowledge of [facility] Education information/materials will improve Outcome: Progressing   Problem: Coping: Goal: Ability to verbalize frustrations and anger appropriately will improve Outcome: Progressing Goal: Ability to demonstrate self-control will improve Outcome: Progressing   Problem: Safety: Goal: Periods of time without injury will increase Outcome: Progressing   Problem: Medication: Goal: Compliance with prescribed medication regimen will improve Outcome: Progressing   Problem: Education: Goal: Knowledge of disease or condition will improve Outcome: Progressing   Problem: Self-Concept: Goal: Will verbalize positive feelings about self Outcome: Progressing Goal: Level of anxiety will decrease Outcome: Progressing   Problem: Self-Care: Goal: Ability to participate in self-care as condition permits will improve Outcome: Progressing   Problem: Education: Goal: Knowledge of [facility] Education information/materials will improve Outcome: Progressing Goal: Emotional status will improve Outcome: Progressing Goal: Mental status will improve Outcome: Progressing Goal: Verbalization of understanding the information provided will improve Outcome: Progressing   Problem: Activity: Goal: Interest or engagement in activities will improve Outcome: Progressing Goal: Sleeping patterns will improve Outcome: Progressing   Problem: Health Behavior/Discharge Planning: Goal: Identification of resources available to assist in meeting health care needs will improve Outcome:  Progressing Goal: Compliance with treatment plan for underlying cause of condition will improve Outcome: Progressing   Problem: Physical Regulation: Goal: Ability to maintain clinical measurements within normal limits will improve Outcome: Progressing   Problem: Activity: Goal: Will identify at least one activity in which they can participate Outcome: Progressing   Problem: Coping: Goal: Ability to identify and develop effective coping behavior will improve Outcome: Progressing Goal: Ability to interact with others will improve Outcome: Progressing Goal: Demonstration of participation in decision-making regarding own care will improve Outcome: Progressing Goal: Ability to use eye contact when communicating with others will improve Outcome: Progressing   Problem: Health Behavior/Discharge Planning: Goal: Identification of resources available to assist in meeting health care needs will improve Outcome: Progressing   Problem: Self-Concept: Goal: Will verbalize positive feelings about self Outcome: Progressing   Problem: Education: Goal: Understanding of discharge needs will improve Outcome: Progressing   Problem: Health Behavior/Discharge Planning: Goal: Ability to identify changes in lifestyle to reduce recurrence of condition will improve Outcome: Progressing Goal: Identification of resources available to assist in meeting health care needs will improve Outcome: Progressing   Problem: Physical Regulation: Goal: Complications related to the disease process, condition or treatment will be avoided or minimized Outcome: Progressing   Problem: Safety: Goal: Ability to remain free from injury will improve Outcome: Progressing   Problem: Activity: Goal: Will verbalize the importance of balancing activity with adequate rest periods Outcome: Progressing   Problem: Education: Goal: Will be free of psychotic symptoms Outcome: Progressing Goal: Knowledge of the prescribed  therapeutic regimen will improve Outcome: Progressing   Problem: Coping: Goal: Coping ability will improve Outcome: Progressing Goal: Will verbalize feelings Outcome: Progressing   Problem: Health Behavior/Discharge Planning: Goal: Compliance with prescribed medication regimen will improve Outcome: Progressing   Problem: Nutritional: Goal: Ability to achieve adequate nutritional intake will improve Outcome: Progressing   Problem: Role Relationship: Goal: Ability to communicate needs accurately will improve Outcome: Progressing Goal:  Ability to interact with others will improve Outcome: Progressing   Problem: Safety: Goal: Ability to redirect hostility and anger into socially appropriate behaviors will improve Outcome: Progressing Goal: Ability to remain free from injury will improve Outcome: Progressing   Problem: Self-Concept: Goal: Will verbalize positive feelings about self Outcome: Progressing

## 2018-06-18 NOTE — Progress Notes (Signed)
Recreation Therapy Notes   Date: 06/18/2018  Time: 9:30 am  Location: Craft Room  Behavioral response: Appropriate  Intervention Topic: Anger Management  Discussion/Intervention:  Group content on today was focused on anger management. The group defined anger and reasons they become angry. Individuals expressed negative way they have dealt with anger in the past. Patients stated some positive ways they could deal with anger in the future. The group described how anger can affect your health and daily plans. Individuals participated in the intervention "Score your anger" where they had a chance to answer questions about themselves and get a score of their anger.  Clinical Observations/Feedback:  Patient came to group and stated that people do not always look at anger the same. He explained that anger management requires accountability. Participant expressed watching the news, deep breathing counting and reading as a way he manages his anger. Individual participated in the intervention during group.  Adaja Wander LRT/CTRS         Markail Diekman 06/18/2018 10:37 AM

## 2018-06-18 NOTE — BHH Group Notes (Signed)
LCSW Group Therapy Note   06/18/2018 12:37 PM   Type of Therapy and Topic:  Group Therapy:  Overcoming Obstacles   Participation Level:  Active   Description of Group:    In this group patients will be encouraged to explore what they see as obstacles to their own wellness and recovery. They will be guided to discuss their thoughts, feelings, and behaviors related to these obstacles. The group will process together ways to cope with barriers, with attention given to specific choices patients can make. Each patient will be challenged to identify changes they are motivated to make in order to overcome their obstacles. This group will be process-oriented, with patients participating in exploration of their own experiences as well as giving and receiving support and challenge from other group members.   Therapeutic Goals: 1. Patient will identify personal and current obstacles as they relate to admission. 2. Patient will identify barriers that currently interfere with their wellness or overcoming obstacles.  3. Patient will identify feelings, thought process and behaviors related to these barriers. 4. Patient will identify two changes they are willing to make to overcome these obstacles:      Summary of Patient Progress Pt was appropriate and respectful in group. Pt reported that his current obstacles are not being honest with himself, patience, and his discharge plan. Pt was able to identify various barriers and ways to overcome his obstacles as well as numerous coping strategies such as: deep breathing, exercise, push ups, play with pets, and meditate.     Therapeutic Modalities:   Cognitive Behavioral Therapy Solution Focused Therapy Motivational Interviewing Relapse Prevention Therapy  Iris Pert, MSW, LCSW Clinical Social Work 06/18/2018 12:37 PM

## 2018-06-19 NOTE — BHH Counselor (Signed)
CSW provided pt with a list of shelter resources. Pt reports he would like to be referred to a shelter in [place] or [place].

## 2018-06-19 NOTE — Progress Notes (Signed)
Patient is and fully alert, appropriate behaviors, follows unit safety guidelines, and interacting well with peers with out any issues. Medication is taken without any side effects , patient continue to improve in his ability to be motivated and denies any SI/HI/AVH. ,mood is bright and affect  is congruent with  Mood,appetite is good , sleep is long and restful ,, patient voice no concerns , patient is aware of his positive coping skills,and encouraged to use them, advised patient 15 minutes safety rounding is in progress no distress

## 2018-06-19 NOTE — Progress Notes (Signed)
Recreation Therapy Notes  INPATIENT RECREATION TR PLAN  Patient Details Name: Chris [name] MRN: [id number] DOB: [birth year] Today's Date: 06/19/2018  Rec Therapy Plan Is patient appropriate for Therapeutic Recreation?: Yes Treatment times per week: at least 3 Estimated Length of Stay: 5-7 days TR Treatment/Interventions: Group participation (Comment)  Discharge Criteria Pt will be discharged from therapy if:: Discharged Treatment plan/goals/alternatives discussed and agreed upon by:: Patient/family  Discharge Summary Short term goals set: Patient will focus on task/topic with 2 prompts from staff within 5 recreation therapy group sessions Short term goals met: Complete Progress toward goals comments: Groups attended Which groups?: Anger management, Goal setting, Self-esteem, Leisure education, Coping skills, Other (Comment)(Time Mananagement, Relaxation, Happiness, Strengths) Reason goals not met: N/A Therapeutic equipment acquired: N/A Reason patient discharged from therapy: Discharge from hospital Pt/family agrees with progress & goals achieved: Yes Date patient discharged from therapy: 06/19/18   Chris [name] 06/19/2018, 11:32 AM

## 2018-06-19 NOTE — Progress Notes (Signed)
Recreation Therapy Notes  Date: 06/19/2018  Time: 9:30 am  Location: Craft Room  Behavioral response: Appropriate  Intervention Topic: Time Management  Discussion/Intervention:  Group content today was focused on time management. The group defined time management and identified healthy ways to manage time. Individuals expressed how much of the 24 hours they use in a day. Patients expressed how much time they use just for themselves personally. The group expressed how they have managed their time in the past. Individuals participated in the intervention "Managing Life" where they had a chance to see how much of the 24 hours they use and where it goes. Clinical Observations/Feedback:  Patient came to group and defined time management as patience. He rated his time management skills as a 10/10 being the best. Participant explained that if he does not have good time management skills it could leave to bad consequences. Individual participated in the intervention during group and was social with peers and staff. Larrie Lucia LRT/CTRS           Savaya Hakes 06/19/2018 10:51 AM

## 2018-06-19 NOTE — Progress Notes (Signed)
  [facility] Adult Case Management Discharge Plan :  Will you be returning to the same living situation after discharge:  No. [facility] At discharge, do you have transportation home?: Yes,  CSW will provide pt with bus ticket Do you have the ability to pay for your medications: No. pt referred to provider who can assist with medication  Release of information consent forms completed and in the chart;  Patient's signature needed at discharge.  Patient to Follow up at: Follow-up Information    [facility]. Go to.   Contact information: [address]  [place], [place] [address] [phone]       [facility] Follow up.   Specialty:  Professional Counselor Why:  Please go to [facility] at [address], [place] [address] or [facility] [address] [place], [place] [address] Monday - Friday 8:30 -12:00pm to pick up a patient information packet.   [phone] Contact information: [facility] [address] [place] [address] [phone]           Next level of care provider has access to [facility] Link:no  Safety Planning and Suicide Prevention discussed: Yes,  with pt; pt declined family contact     Has patient been referred to the Quitline?: N/A patient is not a smoker  Patient has been referred for addiction treatment: N/A  Suzan Slick, LCSW 06/19/2018, 10:33 AM

## 2018-06-19 NOTE — Discharge Summary (Signed)
Physician Discharge Summary Note  Patient:  Chris [name] is an 33 y.o., male MRN:  [id number] DOB:  11-Feb-1986 Patient phone:  [phone] (home)  Patient address:   [place] [address],  Total Time spent with patient: 45 minutes  Date of Admission:  06/05/2018 Date of Discharge: June 19, 2018  Reason for Admission: Patient was admitted to the psychiatric unit after being brought to the emergency room by authorities.  He had been found acting bizarrely in public stepping out into traffic.Principal Problem: Schizoaffective disorder, bipolar type [facility]) Discharge Diagnoses: Principal Problem:   Schizoaffective disorder, bipolar type [facility])   Past Psychiatric History: Patient has a past history of schizoaffective disorder as well as a past history of substance abuse although recent abuse does not seem to have been part of the issue.  Past Medical History:  Past Medical History:  Diagnosis Date  . Anxiety   . Bipolar 2 disorder (HCC)   . Depression   . Schizophrenia (HCC)    History reviewed. No pertinent surgical history. Family History:  Family History  Problem Relation Age of Onset  . Bipolar disorder Other   . Schizophrenia Other    Family Psychiatric  History: None known Social History:  Social History   Substance and Sexual Activity  Alcohol Use Yes  . Alcohol/week: 2.0 standard drinks  . Types: 2 Standard drinks or equivalent per week     Social History   Substance and Sexual Activity  Drug Use Yes  . Types: Marijuana    Social History   Socioeconomic History  . Marital status: Single    Spouse name: Not on file  . Number of children: Not on file  . Years of education: Not on file  . Highest education level: Not on file  Occupational History  . Not on file  Social Needs  . Financial resource strain: Not on file  . Food insecurity:    Worry: Not on file    Inability: Not on file  . Transportation needs:    Medical: Not on file     Non-medical: Not on file  Tobacco Use  . Smoking status: Current Every Day Smoker    Packs/day: 0.50  . Smokeless tobacco: Current User  Substance and Sexual Activity  . Alcohol use: Yes    Alcohol/week: 2.0 standard drinks    Types: 2 Standard drinks or equivalent per week  . Drug use: Yes    Types: Marijuana  . Sexual activity: Yes  Lifestyle  . Physical activity:    Days per week: Not on file    Minutes per session: Not on file  . Stress: Not on file  Relationships  . Social connections:    Talks on phone: Not on file    Gets together: Not on file    Attends religious service: Not on file    Active member of club or organization: Not on file    Attends meetings of clubs or organizations: Not on file    Relationship status: Not on file  Other Topics Concern  . Not on file  Social History Narrative  . Not on file    Hospital Course: Patient admitted to the psychiatric unit.  15-minute checks employed.  Patient was cooperative with treatment.  Initially he was disorganized in his thinking very withdrawn and difficult to engage.  As he acclimated to the antipsychotic he became much more appropriate.  Attended groups appropriately.  Interacted with treatment team and peers appropriately.  Memory and  cognition improved.  At the time of discharge he is able to interact without signs of delusions or psychosis and is denying hallucinations.  Shows good insight about the importance of staying on medication.  Social work and treatment team reviewed his options for outpatient care.  Apparently he does have family in several counties but none of them that seem like they are the best option right now.  He has had difficulty finding a shelter or other accommodation to go to because of his lack of an identification.  Social work was able to find a temporary solution to that with a copy of his hospital photograph and a shelter in [place] that is willing to accept him.  It is worth noting that  despite what he said when he came into the hospital we have NO EVIDENCE that he is a sex offender or in any other way has any problem like that that would limit his options.  Patient is given prescription for his current medicine.  He needs his next shot of Invega in about a week and a half.  He will be given referral to outpatient mental health agencies and providers in the local community.  Patient is agreeable to the plan.  Physical Findings: AIMS: Facial and Oral Movements Muscles of Facial Expression: None, normal Lips and Perioral Area: None, normal Jaw: None, normal Tongue: None, normal,Extremity Movements Upper (arms, wrists, hands, fingers): None, normal Lower (legs, knees, ankles, toes): None, normal, Trunk Movements Neck, shoulders, hips: None, normal, Overall Severity Severity of abnormal movements (highest score from questions above): None, normal Incapacitation due to abnormal movements: None, normal Patient's awareness of abnormal movements (rate only patient's report): No Awareness, Dental Status Current problems with teeth and/or dentures?: No Does patient usually wear dentures?: No  CIWA:    COWS:     Musculoskeletal: Strength & Muscle Tone: within normal limits Gait & Station: normal Patient leans: N/A  Psychiatric Specialty Exam: Physical Exam  Nursing note and vitals reviewed. Constitutional: He appears well-developed and well-nourished.  HENT:  Head: Normocephalic and atraumatic.  Eyes: Pupils are equal, round, and reactive to light. Conjunctivae are normal.  Neck: Normal range of motion.  Cardiovascular: Regular rhythm and normal heart sounds.  Respiratory: Effort normal. No respiratory distress.  GI: Soft.  Musculoskeletal: Normal range of motion.  Neurological: He is alert.  Skin: Skin is warm and dry.  Psychiatric: He has a normal mood and affect. His behavior is normal. Judgment and thought content normal.    Review of Systems  Constitutional:  Negative.   HENT: Negative.   Eyes: Negative.   Respiratory: Negative.   Cardiovascular: Negative.   Gastrointestinal: Negative.   Musculoskeletal: Negative.   Skin: Negative.   Neurological: Negative.   Psychiatric/Behavioral: Negative.     Blood pressure 123/69, pulse 85, temperature 98.2 F (36.8 C), temperature source Oral, resp. rate 18, height 5' 11.65" (1.82 m), weight 79.4 kg, SpO2 100 %.Body mass index is 23.96 kg/m.  General Appearance: Casual and Fairly Groomed  Eye Contact:  Good  Speech:  Clear and Coherent  Volume:  Normal  Mood:  Euthymic  Affect:  Congruent  Thought Process:  Goal Directed  Orientation:  Full (Time, Place, and Person)  Thought Content:  Logical  Suicidal Thoughts:  No  Homicidal Thoughts:  No  Memory:  Immediate;   Fair Recent;   Fair Remote;   Fair  Judgement:  Fair  Insight:  Fair  Psychomotor Activity:  Normal  Concentration:  Concentration: Fair  Recall:  [employer] of Knowledge:  Fair  Language:  Fair  Akathisia:  No  Handed:  Right  AIMS (if indicated):     Assets:  Desire for Improvement Physical Health Resilience  ADL's:  Intact  Cognition:  WNL  Sleep:  Number of Hours: 7.75        Has this patient used any form of tobacco in the last 30 days? (Cigarettes, Smokeless Tobacco, Cigars, and/or Pipes) Yes, Yes, A prescription for an FDA-approved tobacco cessation medication was offered at discharge and the patient refused  Blood Alcohol level:  Lab Results  Component Value Date   ETH <10 06/05/2018   ETH 67 (H) 09/26/2017    Metabolic Disorder Labs:  Lab Results  Component Value Date   HGBA1C 5.6 06/05/2018   MPG 114.02 06/05/2018   MPG 94 12/01/2015   Lab Results  Component Value Date   PROLACTIN 58.4 (H) 12/01/2015   Lab Results  Component Value Date   CHOL 111 06/05/2018   TRIG 66 06/05/2018   HDL 38 (L) 06/05/2018   CHOLHDL 2.9 06/05/2018   VLDL 13 06/05/2018   LDLCALC 60 06/05/2018   LDLCALC 50  12/01/2015    See Psychiatric Specialty Exam and Suicide Risk Assessment completed by Attending Physician prior to discharge.  Discharge destination:  Home  Is patient on multiple antipsychotic therapies at discharge:  No   Has Patient had three or more failed trials of antipsychotic monotherapy by history:  No  Recommended Plan for Multiple Antipsychotic Therapies: NA  Discharge Instructions    Diet - low sodium heart healthy   Complete by:  As directed    Increase activity slowly   Complete by:  As directed      Allergies as of 06/19/2018      Reactions   Penicillins Hives, Rash   Has patient had a PCN reaction causing immediate rash, facial/tongue/throat swelling, SOB or lightheadedness with hypotension: YES Has patient had a PCN reaction causing severe rash involving mucus membranes or skin necrosis: YES Has patient had a PCN reaction that required hospitalization NO Has patient had a PCN reaction occurring within the last 10 years:NO If all of the above answers are "NO", then may proceed with Cephalosporin use.      Medication List    TAKE these medications     Indication  benztropine 0.5 MG tablet Commonly known as:  COGENTIN Take 1 tablet (0.5 mg total) by mouth 2 (two) times daily.  Indication:  Extrapyramidal Reaction caused by Medications   ibuprofen 200 MG tablet Commonly known as:  ADVIL,MOTRIN Take 400 mg by mouth 2 (two) times daily as needed for moderate pain.  Indication:  Mild to Moderate Pain   paliperidone 234 MG/1.5ML Susy injection Commonly known as:  INVEGA SUSTENNA Inject 234 mg into the muscle every 28 (twenty-eight) days. Start taking on:  July 04, 2018  Indication:  Schizoaffective Disorder   temazepam 15 MG capsule Commonly known as:  RESTORIL Take 1 capsule (15 mg total) by mouth at bedtime.  Indication:  Trouble Sleeping   traZODone 100 MG tablet Commonly known as:  DESYREL Take 1 tablet (100 mg total) by mouth at bedtime as needed  for sleep.  Indication:  Trouble Sleeping      Follow-up Information    [facility] [employer] [facility]. Go to.   Contact information: [address]  [place], [place] [address] [phone]       [facility] Follow up.   Specialty:  Professional Counselor Why:  Please go to [facility] at [address], [place] [address] or [facility] [address] [place], [place] [address] Monday - Friday 8:30 -12:00pm to pick up a patient information packet.   [phone] Contact information: [facility] [address] [place] [address] [phone]           Follow-up recommendations:  Activity:  Activity as tolerated Diet:  Regular diet Other:  Make sure to follow-up with local mental health agency so you can get your shots and stay on your medicine.  Do not use alcohol or drugs.  Engage in appropriate support treatment for substance abuse issues.  Comments: Patient is agreeable to the plan.  He has limited social options at this point and I think that our current plan is probably about as good as it is going to get for him.  Patient understands the risks and agrees that he will make every effort to follow-up with appropriate outpatient care.  Signed: Mordecai Rasmussen, MD 06/19/2018, 11:25 AM

## 2018-06-19 NOTE — Progress Notes (Signed)
Patient denies SI/HI, denies A/V hallucinations. Patient verbalizes understanding of discharge instructions, follow up care and prescriptions.7 days medicines give to patient. Patient given all belongings from [facility] locker. Patient escorted out by staff, transported to bus stop by cab.

## 2018-06-19 NOTE — [facility] Suicide Risk Assessment (Signed)
[facility] INPATIENT:  Family/Significant Other Suicide Prevention Education  Suicide Prevention Education:  Patient Refusal for Family/Significant Other Suicide Prevention Education: The patient Chris [name] has refused to provide written consent for family/significant other to be provided Family/Significant Other Suicide Prevention Education during admission and/or prior to discharge.  Physician notified.  Reneka Nebergall T Barbara Keng 06/19/2018, 9:38 AM

## 2018-06-26 ENCOUNTER — Other Ambulatory Visit: Payer: Self-pay | Admitting: Psychiatry
